# Patient Record
Sex: Female | Born: 1975 | Race: Black or African American | Hispanic: No | Marital: Married | Smoking: Former smoker
Health system: Southern US, Community
[De-identification: ages and names within clinical notes are randomized; demographics above are authoritative.]

## PROBLEM LIST (undated history)

## (undated) DIAGNOSIS — J45909 Unspecified asthma, uncomplicated: Secondary | ICD-10-CM

## (undated) DIAGNOSIS — G8929 Other chronic pain: Secondary | ICD-10-CM

## (undated) DIAGNOSIS — O24419 Gestational diabetes mellitus in pregnancy, unspecified control: Secondary | ICD-10-CM

## (undated) DIAGNOSIS — E282 Polycystic ovarian syndrome: Secondary | ICD-10-CM

## (undated) DIAGNOSIS — I10 Essential (primary) hypertension: Secondary | ICD-10-CM

## (undated) DIAGNOSIS — M5432 Sciatica, left side: Secondary | ICD-10-CM

## (undated) HISTORY — PX: CHOLECYSTECTOMY: SHX55

---

## 1997-07-18 DIAGNOSIS — A749 Chlamydial infection, unspecified: Secondary | ICD-10-CM

## 1997-07-18 HISTORY — DX: Chlamydial infection, unspecified: A74.9

## 2017-03-15 ENCOUNTER — Emergency Department: Payer: Self-pay

## 2017-03-15 ENCOUNTER — Emergency Department
Admission: EM | Admit: 2017-03-15 | Discharge: 2017-03-15 | Disposition: A | Discharge status: Home / Self Care | Payer: Self-pay | Attending: Emergency Medicine | Admitting: Emergency Medicine

## 2017-03-15 DIAGNOSIS — F17200 Nicotine dependence, unspecified, uncomplicated: Secondary | ICD-10-CM | POA: Insufficient documentation

## 2017-03-15 DIAGNOSIS — R1031 Right lower quadrant pain: Secondary | ICD-10-CM | POA: Insufficient documentation

## 2017-03-15 DIAGNOSIS — J45909 Unspecified asthma, uncomplicated: Secondary | ICD-10-CM | POA: Insufficient documentation

## 2017-03-15 HISTORY — DX: Unspecified asthma, uncomplicated: J45.909

## 2017-03-15 LAB — URINALYSIS, COMPLETE (UACMP) WITH MICROSCOPIC
BACTERIA UA: NONE SEEN
Bilirubin Urine: NEGATIVE
GLUCOSE, UA: NEGATIVE mg/dL
KETONES UR: NEGATIVE mg/dL
Leukocytes, UA: NEGATIVE
Nitrite: NEGATIVE
PROTEIN: NEGATIVE mg/dL
Specific Gravity, Urine: 1.019 (ref 1.005–1.030)
pH: 6 (ref 5.0–8.0)

## 2017-03-15 LAB — CBC WITH DIFFERENTIAL/PLATELET
Basophils Absolute: 0.1 10*3/uL (ref 0–0.1)
Basophils Relative: 1 %
Eosinophils Absolute: 0.1 10*3/uL (ref 0–0.7)
Eosinophils Relative: 2 %
HEMATOCRIT: 33.2 % — AB (ref 35.0–47.0)
HEMOGLOBIN: 10.4 g/dL — AB (ref 12.0–16.0)
LYMPHS PCT: 44 %
Lymphs Abs: 2.9 10*3/uL (ref 1.0–3.6)
MCH: 19.4 pg — AB (ref 26.0–34.0)
MCHC: 31.2 g/dL — AB (ref 32.0–36.0)
MCV: 62.2 fL — AB (ref 80.0–100.0)
MONOS PCT: 6 %
Monocytes Absolute: 0.4 10*3/uL (ref 0.2–0.9)
NEUTROS ABS: 3.1 10*3/uL (ref 1.4–6.5)
NEUTROS PCT: 47 %
Platelets: 359 10*3/uL (ref 150–440)
RBC: 5.33 MIL/uL — AB (ref 3.80–5.20)
RDW: 17.7 % — ABNORMAL HIGH (ref 11.5–14.5)
WBC: 6.6 10*3/uL (ref 3.6–11.0)

## 2017-03-15 LAB — BASIC METABOLIC PANEL
ANION GAP: 8 (ref 5–15)
BUN: 8 mg/dL (ref 6–20)
CALCIUM: 8.8 mg/dL — AB (ref 8.9–10.3)
CO2: 22 mmol/L (ref 22–32)
Chloride: 107 mmol/L (ref 101–111)
Creatinine, Ser: 0.79 mg/dL (ref 0.44–1.00)
GLUCOSE: 144 mg/dL — AB (ref 65–99)
POTASSIUM: 3.3 mmol/L — AB (ref 3.5–5.1)
Sodium: 137 mmol/L (ref 135–145)

## 2017-03-15 LAB — HEPATIC FUNCTION PANEL
ALBUMIN: 3.3 g/dL — AB (ref 3.5–5.0)
ALT: 13 U/L — AB (ref 14–54)
AST: 24 U/L (ref 15–41)
Alkaline Phosphatase: 43 U/L (ref 38–126)
Total Bilirubin: 0.3 mg/dL (ref 0.3–1.2)
Total Protein: 6.6 g/dL (ref 6.5–8.1)

## 2017-03-15 LAB — HCG, QUANTITATIVE, PREGNANCY

## 2017-03-15 LAB — LIPASE, BLOOD: Lipase: 27 U/L (ref 11–51)

## 2017-03-15 MED ORDER — ONDANSETRON HCL 4 MG/2ML IJ SOLN
4.0000 mg | Freq: Once | INTRAMUSCULAR | Status: AC
  Administered 2017-03-15: 4 mg via INTRAVENOUS
  Filled 2017-03-15: qty 2

## 2017-03-15 MED ORDER — IOPAMIDOL (ISOVUE-300) INJECTION 61%
100.0000 mL | Freq: Once | INTRAVENOUS | Status: AC | PRN
  Administered 2017-03-15: 100 mL via INTRAVENOUS

## 2017-03-15 MED ORDER — MORPHINE SULFATE (PF) 4 MG/ML IV SOLN
4.0000 mg | Freq: Once | INTRAVENOUS | Status: AC
  Administered 2017-03-15: 4 mg via INTRAVENOUS
  Filled 2017-03-15: qty 1

## 2017-03-15 NOTE — ED Triage Notes (Signed)
Abdominal pain X 2 weeks. Dx with UTI Friday and started on Bactrim. Pt c/o lower back pain as well. Pt alert and oriented X4, active, cooperative, pt in NAD. RR even and unlabored, color WNL.

## 2017-03-15 NOTE — ED Provider Notes (Signed)
[facility] Emergency Department Provider Note  ____________________________________________   First MD Initiated Contact with Patient 03/15/17 1555     (approximate)  I have reviewed the triage vital signs and the nursing notes.   HISTORY  Chief Complaint Abdominal Pain    HPI Erica Pierce is a 41 y.o. female who comes to the emergency department via EMS with 2 weeks of intermittent moderate severity right sided abdominal pain. 6  She has a remote abdominal surgical historydays ago she was seen in her primary care physician's office and diagnosed with a urinary tract infection for which she completed a course of nitrofurantoin. She says this has not helped. She has no back pain. No fevers or chills. No nausea or vomiting.she has no abdominal surgical history. Her pain is currently moderate to severe. She is currently menstruating.   Past Medical History:  Diagnosis Date  . Asthma     There are no active problems to display for this patient.   History reviewed. No pertinent surgical history.  Prior to Admission medications   Not on File    Allergies Sulfa antibiotics and Sulfur  No family history on file.  Social History Social History  Substance Use Topics  . Smoking status: Current Every Day Smoker  . Smokeless tobacco: Never Used  . Alcohol use No    Review of Systems Constitutional: No fever/chills Eyes: No visual changes. ENT: No sore throat. Cardiovascular: Denies chest pain. Respiratory: Denies shortness of breath. Gastrointestinal: positive abdominal pain.  No nausea, no vomiting.  No diarrhea.  No constipation. Genitourinary: Negative for dysuria. Musculoskeletal: Negative for back pain. Skin: Negative for rash. Neurological: Negative for headaches, focal weakness or numbness.   ____________________________________________   PHYSICAL EXAM:  VITAL SIGNS: ED Triage Vitals  Enc Vitals Group     BP 03/15/17 1552  (!) 167/87     Pulse Rate 03/15/17 1552 98     Resp 03/15/17 1552 18     Temp 03/15/17 1552 99.8 F (37.7 C)     Temp Source 03/15/17 1552 Oral     SpO2 03/15/17 1552 100 %     Weight 03/15/17 1553 263 lb (119.3 kg)     Height 03/15/17 1553 5\' 6"  (1.676 m)     Head Circumference --      Peak Flow --      Pain Score 03/15/17 1552 9     Pain Loc --      Pain Edu? --      Excl. in GC? --     Constitutional: alert and oriented 4 pleasant cooperative speaks in full clear sentences no diaphoresis Eyes: PERRL EOMI. Head: Atraumatic. Nose: No congestion/rhinnorhea. Mouth/Throat: No trismus Neck: No stridor.   Cardiovascular: Normal rate, regular rhythm. Grossly normal heart sounds.  Good peripheral circulation. Respiratory: Normal respiratory effort.  No retractions. Lungs CTAB and moving good air Gastrointestinal: obese soft mild diffuse tenderness worse left lower quadrant and right lower quadrant although negative Rovsing's no rebound or guarding Musculoskeletal: No lower extremity edema   Neurologic:  Normal speech and language. No gross focal neurologic deficits are appreciated. Skin:  Skin is warm, dry and intact. No rash noted. Psychiatric: Mood and affect are normal. Speech and behavior are normal.    ____________________________________________   DIFFERENTIAL includes but not limited to  Appendicitis, diverticulitis, small bowel obstruction, urinary tract infection, renal colic, pyelonephritis, ovarian cyst ____________________________________________   LABS (all labs ordered are listed, but only abnormal results are displayed)  Labs Reviewed  BASIC METABOLIC PANEL - Abnormal; Notable for the following:       Result Value   Potassium 3.3 (*)    Glucose, Bld 144 (*)    Calcium 8.8 (*)    All other components within normal limits  HEPATIC FUNCTION PANEL - Abnormal; Notable for the following:    Albumin 3.3 (*)    ALT 13 (*)    Bilirubin, Direct <0.1 (*)    All  other components within normal limits  CBC WITH DIFFERENTIAL/PLATELET - Abnormal; Notable for the following:    RBC 5.33 (*)    Hemoglobin 10.4 (*)    HCT 33.2 (*)    MCV 62.2 (*)    MCH 19.4 (*)    MCHC 31.2 (*)    RDW 17.7 (*)    All other components within normal limits  URINALYSIS, COMPLETE (UACMP) WITH MICROSCOPIC - Abnormal; Notable for the following:    Color, Urine YELLOW (*)    APPearance CLEAR (*)    Hgb urine dipstick LARGE (*)    Squamous Epithelial / LPF 0-5 (*)    All other components within normal limits  URINE CULTURE  CHLAMYDIA/NGC RT PCR ([facility] ONLY)  LIPASE, BLOOD  HCG, QUANTITATIVE, PREGNANCY    Hematuria consistent with menses __________________________________________  EKG   ____________________________________________  RADIOLOGY  CT scan shows resolving cystitis ____________________________________________   PROCEDURES  Procedure(s) performed: no  Procedures  Critical Care performed: no  Observation: no ____________________________________________   INITIAL IMPRESSION / ASSESSMENT AND PLAN / ED COURSE  Pertinent labs & imaging results that were available during my care of the patient were reviewed by me and considered in my medical decision making (see chart for details).  The patient arrives very well-appearing with roughly 1 week of symptoms. She is slightly tender in her left greater than right lower quadrants. No frank peritonitis. She has no costovertebral tenderness negative do not suspect pyelonephritis. At this point given the diagnostic uncertainty however I do believe she warrants labs including a lipase as well as a CT scan abdomen and pelvis to evaluate for appendicitis etc.    ----------------------------------------- 4:29 PM on 03/15/2017 -----------------------------------------  Nursing was able to obtain collateral history. The patient came to the emergency department today because her husband was pulled over  by police and arrested today for possession of narcotics. When the police pulled the car over she reported the abdominal pain and requested transportation to the hospital. ____________________________________________  ----------------------------------------- 5:57 PM on 03/15/2017 -----------------------------------------  The patient's pain is improved. Her abdomen is benign. She is able to eat and drink. Her CT scan is reassuring. I have recommended a 2 day abdominal recheck and strict return precautions. FINAL CLINICAL IMPRESSION(S) / ED DIAGNOSES  Final diagnoses:  Right lower quadrant abdominal pain      NEW MEDICATIONS STARTED DURING THIS VISIT:  New Prescriptions   No medications on file     Note:  This document was prepared using Dragon voice recognition software and may include unintentional dictation errors.     Merrily Brittle, MD 03/15/17 1757

## 2017-03-15 NOTE — Discharge Instructions (Signed)
Fortunately today your CT scan, blood work, and urinalysis were reassuring.  I don't have a clear explanation for your pain however he do not require further antibiotics and do not require surgery. Please make an appointment to follow-up with your primary care physician in 2 days for reexamination and return to the emergency department sooner for any concerns.  It was a pleasure to take care of you today, and thank you for coming to our emergency department.  If you have any questions or concerns before leaving please ask the nurse to grab me and I'm more than happy to go through your aftercare instructions again.  If you were prescribed any opioid pain medication today such as Norco, Vicodin, Percocet, morphine, hydrocodone, or oxycodone please make sure you do not drive when you are taking this medication as it can alter your ability to drive safely.  If you have any concerns once you are home that you are not improving or are in fact getting worse before you can make it to your follow-up appointment, please do not hesitate to call 911 and come back for further evaluation.  Merrily BrittleNeil Yaritzi Craun, MD  Results for orders placed or performed during the hospital encounter of 03/15/17  Basic metabolic panel  Result Value Ref Range   Sodium 137 135 - 145 mmol/L   Potassium 3.3 (L) 3.5 - 5.1 mmol/L   Chloride 107 101 - 111 mmol/L   CO2 22 22 - 32 mmol/L   Glucose, Bld 144 (H) 65 - 99 mg/dL   BUN 8 6 - 20 mg/dL   Creatinine, Ser 1.610.79 0.44 - 1.00 mg/dL   Calcium 8.8 (L) 8.9 - 10.3 mg/dL   GFR calc non Af Amer >60 >60 mL/min   GFR calc Af Amer >60 >60 mL/min   Anion gap 8 5 - 15  Hepatic function panel  Result Value Ref Range   Total Protein 6.6 6.5 - 8.1 g/dL   Albumin 3.3 (L) 3.5 - 5.0 g/dL   AST 24 15 - 41 U/L   ALT 13 (L) 14 - 54 U/L   Alkaline Phosphatase 43 38 - 126 U/L   Total Bilirubin 0.3 0.3 - 1.2 mg/dL   Bilirubin, Direct <0.9<0.1 (L) 0.1 - 0.5 mg/dL   Indirect Bilirubin NOT CALCULATED 0.3 -  0.9 mg/dL  Lipase, blood  Result Value Ref Range   Lipase 27 11 - 51 U/L  CBC with Differential  Result Value Ref Range   WBC 6.6 3.6 - 11.0 K/uL   RBC 5.33 (H) 3.80 - 5.20 MIL/uL   Hemoglobin 10.4 (L) 12.0 - 16.0 g/dL   HCT 60.433.2 (L) 54.035.0 - 98.147.0 %   MCV 62.2 (L) 80.0 - 100.0 fL   MCH 19.4 (L) 26.0 - 34.0 pg   MCHC 31.2 (L) 32.0 - 36.0 g/dL   RDW 19.117.7 (H) 47.811.5 - 29.514.5 %   Platelets 359 150 - 440 K/uL   Neutrophils Relative % 47 %   Neutro Abs 3.1 1.4 - 6.5 K/uL   Lymphocytes Relative 44 %   Lymphs Abs 2.9 1.0 - 3.6 K/uL   Monocytes Relative 6 %   Monocytes Absolute 0.4 0.2 - 0.9 K/uL   Eosinophils Relative 2 %   Eosinophils Absolute 0.1 0 - 0.7 K/uL   Basophils Relative 1 %   Basophils Absolute 0.1 0 - 0.1 K/uL  Urinalysis, Complete w Microscopic  Result Value Ref Range   Color, Urine YELLOW (A) YELLOW   APPearance CLEAR (A) CLEAR  Specific Gravity, Urine 1.019 1.005 - 1.030   pH 6.0 5.0 - 8.0   Glucose, UA NEGATIVE NEGATIVE mg/dL   Hgb urine dipstick LARGE (A) NEGATIVE   Bilirubin Urine NEGATIVE NEGATIVE   Ketones, ur NEGATIVE NEGATIVE mg/dL   Protein, ur NEGATIVE NEGATIVE mg/dL   Nitrite NEGATIVE NEGATIVE   Leukocytes, UA NEGATIVE NEGATIVE   RBC / HPF TOO NUMEROUS TO COUNT 0 - 5 RBC/hpf   WBC, UA 6-30 0 - 5 WBC/hpf   Bacteria, UA NONE SEEN NONE SEEN   Squamous Epithelial / LPF 0-5 (A) NONE SEEN   Mucus PRESENT   hCG, quantitative, pregnancy  Result Value Ref Range   hCG, Beta Chain, Quant, S <1 <5 mIU/mL   Ct Abdomen Pelvis W Contrast  Result Date: 03/15/2017 CLINICAL DATA:  Initial evaluation for acute abdominal pain for 2 weeks, recently diagnosed with UTI. EXAM: CT ABDOMEN AND PELVIS WITH CONTRAST TECHNIQUE: Multidetector CT imaging of the abdomen and pelvis was performed using the standard protocol following bolus administration of intravenous contrast. CONTRAST:  ISOVUE-300 IOPAMIDOL (ISOVUE-300) INJECTION 61% COMPARISON:  None available. FINDINGS: Lower  chest: Mild scattered subsegmental atelectatic changes present within the visualized lung bases. Visualized lungs are otherwise clear. Hepatobiliary: Subcentimeter hypodensity within the right hepatic lobe noted, too small the characterize, but of doubtful clinical significance. Liver otherwise demonstrates a normal contrast enhanced appearance. Gallbladder surgically absent. Mild intra and extrahepatic biliary dilatation likely related post cholecystectomy changes. Pancreas: Pancreas within normal limits. Spleen: Spleen within normal limits. Adrenals/Urinary Tract: Adrenal glands are normal. Kidneys equal in size with symmetric enhancement. No nephrolithiasis, hydronephrosis, or focal enhancing renal mass. No findings to suggest acute pyelonephritis. No hydroureter. Bladder partially distended. Circumferential bladder wall thickening may be related incomplete distension. Superimposed acute cystitis not entirely excluded. Stomach/Bowel: Small amount of layering secretions/ debris noted within the distal esophageal lumen. Stomach within normal limits. No evidence for bowel obstruction. Appendix is normal. No abnormal wall thickening, mucosal enhancement, or inflammatory fat stranding seen about the bowels. Prominent cecal diverticulum noted. Vascular/Lymphatic: Normal intravascular enhancement seen throughout the intra-abdominal aorta and its branch vessels. No pathologically enlarged intra-abdominal or pelvic lymph nodes identified. Reproductive: Uterus and ovaries within normal limits for age. Other: Trace free fluid within the pelvis, likely physiologic. No free intraperitoneal air. Musculoskeletal: No acute osseus abnormality. No worrisome lytic or blastic osseous lesions. Bilateral facet arthropathy noted within the lower lumbar spine. IMPRESSION: 1. Mild circumferential bladder wall thickening. While this finding may be related incomplete distension, possible acute cystitis could also have this appearance.  Correlation with urinalysis recommended. 2. No other acute intra-abdominal or pelvic process. No CT evidence for upper urinary tract infection. 3. Bilateral facet arthropathy within the lower lumbar spine. Electronically Signed   By: Rise Mu M.D.   On: 03/15/2017 17:48

## 2017-03-15 NOTE — ED Notes (Signed)
Pt alert and oriented X4, active, cooperative, pt in NAD. RR even and unlabored, color WNL.  Pt informed to return if any life threatening symptoms occur.   

## 2017-03-15 NOTE — ED Notes (Signed)
Patient transported to CT 

## 2017-03-15 NOTE — ED Notes (Signed)
Denies burning with urination, states color is lighter than it was before taking antibiotics. Denies any abnormal discharge.

## 2017-03-17 LAB — URINE CULTURE

## 2017-05-29 ENCOUNTER — Emergency Department
Admission: EM | Admit: 2017-05-29 | Discharge: 2017-05-29 | Disposition: A | Discharge status: Home / Self Care | Payer: Worker's Compensation | Attending: Emergency Medicine | Admitting: Emergency Medicine

## 2017-05-29 ENCOUNTER — Other Ambulatory Visit: Payer: Self-pay

## 2017-05-29 ENCOUNTER — Emergency Department: Payer: Worker's Compensation

## 2017-05-29 DIAGNOSIS — Y9289 Other specified places as the place of occurrence of the external cause: Secondary | ICD-10-CM | POA: Diagnosis not present

## 2017-05-29 DIAGNOSIS — Y990 Civilian activity done for income or pay: Secondary | ICD-10-CM | POA: Insufficient documentation

## 2017-05-29 DIAGNOSIS — J45909 Unspecified asthma, uncomplicated: Secondary | ICD-10-CM | POA: Diagnosis not present

## 2017-05-29 DIAGNOSIS — F17210 Nicotine dependence, cigarettes, uncomplicated: Secondary | ICD-10-CM | POA: Diagnosis not present

## 2017-05-29 DIAGNOSIS — M791 Myalgia, unspecified site: Secondary | ICD-10-CM | POA: Diagnosis not present

## 2017-05-29 DIAGNOSIS — S39012A Strain of muscle, fascia and tendon of lower back, initial encounter: Secondary | ICD-10-CM

## 2017-05-29 DIAGNOSIS — G8911 Acute pain due to trauma: Secondary | ICD-10-CM | POA: Diagnosis not present

## 2017-05-29 DIAGNOSIS — S3992XA Unspecified injury of lower back, initial encounter: Secondary | ICD-10-CM | POA: Diagnosis present

## 2017-05-29 DIAGNOSIS — X500XXA Overexertion from strenuous movement or load, initial encounter: Secondary | ICD-10-CM | POA: Insufficient documentation

## 2017-05-29 DIAGNOSIS — Y9389 Activity, other specified: Secondary | ICD-10-CM | POA: Insufficient documentation

## 2017-05-29 MED ORDER — NAPROXEN 500 MG PO TABS: 500.0000 mg | ORAL_TABLET | Freq: Two times a day (BID) | ORAL | 0 refills | Status: DC

## 2017-05-29 MED ORDER — KETOROLAC TROMETHAMINE 30 MG/ML IJ SOLN
30.0000 mg | Freq: Once | INTRAMUSCULAR | Status: AC
  Administered 2017-05-29: 30 mg via INTRAMUSCULAR
  Filled 2017-05-29: qty 1

## 2017-05-29 MED ORDER — CYCLOBENZAPRINE HCL 10 MG PO TABS
10.0000 mg | ORAL_TABLET | Freq: Once | ORAL | Status: AC
  Administered 2017-05-29: 10 mg via ORAL
  Filled 2017-05-29: qty 1

## 2017-05-29 MED ORDER — CYCLOBENZAPRINE HCL 10 MG PO TABS: 10.0000 mg | ORAL_TABLET | Freq: Three times a day (TID) | ORAL | 0 refills | Status: DC | PRN

## 2017-05-29 NOTE — Discharge Instructions (Signed)
Follow up with primary care provider for symptoms that are not improving over the week. Return to the emergency department for symptoms that change or worsen if you are unable to schedule an appointment.

## 2017-05-29 NOTE — ED Notes (Signed)
Family at bedside. 

## 2017-05-29 NOTE — ED Triage Notes (Signed)
Pt reports back pain after bending over at work. Pt able to ambulate without difficulty. Reports this happened this AM. Pt works at [employer] and wants to file workers comp.

## 2017-05-29 NOTE — ED Provider Notes (Signed)
[facility] Emergency Department Provider Note ____________________________________________  Time seen: Approximately 9:07 AM  I have reviewed the triage vital signs and the nursing notes.   HISTORY  Chief Complaint Back Pain    HPI Erica Pierce is a 41 y.o. 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 Incident occurred this morning at about 2:00 AM. She states she felt pain immediately afterward. Pain radiates down both legs. She now also has pain in the left lateral neck and shoulder. Ibuprofen at 4:00 AM has not helped with her pain.  Past Medical History:  Diagnosis Date  . Asthma     There are no active problems to display for this patient.   History reviewed. No pertinent surgical history.  Prior to Admission medications   Not on File    Allergies Sulfa antibiotics and Sulfur  No family history on file.  Social History Social History   Tobacco Use  . Smoking status: Current Every Day Smoker  . Smokeless tobacco: Never Used  Substance Use Topics  . Alcohol use: No  . Drug use: Not on file    Review of Systems Constitutional: Well appearing. Cardiovascular: Negative for change in skin temperature or color. Respiratory: Negative for dyspnea. Musculoskeletal:   Negative for fecal incontinence,  Saddle anesthesia, or urinary retention  Negative for immunosuppression, IV drug use, or fever  Negative for chronic steroid use   Negative for trauma in the presence of osteoporosis  Negative for age over 6650 and trauma.  Negative for constitutional symptoms, or history of cancer  Negative for pain worse at night.  Negative for focal neurologic deficit, progressive, or disabling symptoms Skin: Negative for rash, lesion, or wound.  Neurological: Negative for burning, tingling, numb, electric pain,  Positive for radiating pain in the bilateral lower extremities.  ____________________________________________   PHYSICAL EXAM:  VITAL SIGNS: ED Triage Vitals [05/29/17 0834]  Enc Vitals Group     BP 139/87     Pulse Rate 86     Resp 18     Temp 97.7 F (36.5 C)     Temp Source Oral     SpO2 99 %     Weight 240 lb (108.9 kg)     Height 5\' 6"  (1.676 m)     Head Circumference      Peak Flow      Pain Score 10     Pain Loc      Pain Edu?      Excl. in GC?     Constitutional: Alert and oriented. Well appearing and in no acute distress. Eyes: Conjunctivae are clear without discharge or drainage.  Head: Atraumatic. Neck: Full, active range of motion. Respiratory: Respirations even and unlabored. Musculoskeletal: Decreased flexion at the lumbar due to pain, Strength 5/5 of the lower extremities as tested. Full, active range of motion of the left shoulder without step-off or deformity. Full, active range of motion of the cervical spine is noted with no focal midline tenderness. Neurologic: Reflexes of the lower extremities are 2+. Negative straight leg raise on the right and left side. Skin: Atraumatic.  Psychiatric: Behavior and affect are normal.  ____________________________________________   LABS (all labs ordered are listed, but only abnormal results are displayed)  Labs Reviewed - No data to display ____________________________________________  RADIOLOGY  Lumbar Spine:  IMPRESSION:  Normal alignment without  significant disc space narrowing.   ____________________________________________   PROCEDURES  Procedure(s) performed: None  ____________________________________________   INITIAL IMPRESSION / ASSESSMENT AND PLAN / ED COURSE  Erica Pierce is a 41 y.o. female who presents to the emergency department for evaluation after sustaining an injury to her lower back while at work. X-ray of the lumbar spine to be performed. Toradol and Flexeril have been  ordered.  ----------------------------------------- 10:12 AM on 05/29/2017 -----------------------------------------  X-ray results are negative and were discussed with the patient. She will be given prescriptions for Flexeril and Naprosyn and advised to rest and apply ice to sore areas today. She was encouraged to follow up with her primary care provider for symptoms that are not improving over the next week or so. She was instructed to return to the emergency department for symptoms that change or worsen if she is unable schedule an appointment.  Pertinent labs & imaging results that were available during my care of the patient were reviewed by me and considered in my medical decision making (see chart for details).  _________________________________________   FINAL CLINICAL IMPRESSION(S) / ED DIAGNOSES  Final diagnoses:  None    This SmartLink is deprecated. Use AVSMEDLIST instead to display the medication list for a patient.  If controlled substance prescribed during this visit, 12 month history viewed on the [facility] prior to issuing an initial prescription for Schedule II or III opiod.    Chinita Pesterriplett, Momen Ham B, FNP 05/29/17 1013    Jeanmarie PlantMcShane, James A, MD 05/29/17 1537

## 2017-06-12 ENCOUNTER — Other Ambulatory Visit: Payer: Self-pay | Admitting: Physician Assistant

## 2017-06-12 ENCOUNTER — Ambulatory Visit
Admission: RE | Admit: 2017-06-12 | Discharge: 2017-06-12 | Disposition: A | Discharge status: Home / Self Care | Payer: Self-pay | Source: Ambulatory Visit | Attending: Physician Assistant | Admitting: Physician Assistant

## 2017-06-12 ENCOUNTER — Ambulatory Visit
Admission: RE | Admit: 2017-06-12 | Discharge: 2017-06-12 | Disposition: A | Discharge status: Home / Self Care | Payer: Worker's Compensation | Source: Ambulatory Visit | Attending: Physician Assistant | Admitting: Physician Assistant

## 2017-06-12 DIAGNOSIS — M25512 Pain in left shoulder: Secondary | ICD-10-CM

## 2017-08-15 ENCOUNTER — Other Ambulatory Visit: Payer: Self-pay

## 2017-08-15 ENCOUNTER — Encounter: Payer: Self-pay | Admitting: Emergency Medicine

## 2017-08-15 ENCOUNTER — Emergency Department: Payer: Self-pay

## 2017-08-15 ENCOUNTER — Emergency Department
Admission: EM | Admit: 2017-08-15 | Discharge: 2017-08-15 | Disposition: A | Discharge status: Home / Self Care | Payer: Self-pay | Attending: Emergency Medicine | Admitting: Emergency Medicine

## 2017-08-15 DIAGNOSIS — R002 Palpitations: Secondary | ICD-10-CM | POA: Insufficient documentation

## 2017-08-15 DIAGNOSIS — F17200 Nicotine dependence, unspecified, uncomplicated: Secondary | ICD-10-CM | POA: Insufficient documentation

## 2017-08-15 DIAGNOSIS — K047 Periapical abscess without sinus: Secondary | ICD-10-CM | POA: Insufficient documentation

## 2017-08-15 DIAGNOSIS — Z79899 Other long term (current) drug therapy: Secondary | ICD-10-CM | POA: Insufficient documentation

## 2017-08-15 DIAGNOSIS — J45909 Unspecified asthma, uncomplicated: Secondary | ICD-10-CM | POA: Insufficient documentation

## 2017-08-15 LAB — BASIC METABOLIC PANEL
ANION GAP: 6 (ref 5–15)
BUN: 5 mg/dL — ABNORMAL LOW (ref 6–20)
CHLORIDE: 103 mmol/L (ref 101–111)
CO2: 24 mmol/L (ref 22–32)
Calcium: 8.6 mg/dL — ABNORMAL LOW (ref 8.9–10.3)
Creatinine, Ser: 0.87 mg/dL (ref 0.44–1.00)
GFR calc Af Amer: >60 mL/min (ref >60)
GLUCOSE: 108 mg/dL — AB (ref 65–99)
POTASSIUM: 3.8 mmol/L (ref 3.5–5.1)
Sodium: 133 mmol/L — ABNORMAL LOW (ref 135–145)

## 2017-08-15 LAB — CBC
HCT: 34.6 % — ABNORMAL LOW (ref 35.0–47.0)
Hemoglobin: 10.6 g/dL — ABNORMAL LOW (ref 12.0–16.0)
MCH: 19.2 pg — AB (ref 26.0–34.0)
MCHC: 30.6 g/dL — AB (ref 32.0–36.0)
MCV: 62.9 fL — ABNORMAL LOW (ref 80.0–100.0)
PLATELETS: 376 10*3/uL (ref 150–440)
RBC: 5.5 MIL/uL — ABNORMAL HIGH (ref 3.80–5.20)
RDW: 17.5 % — ABNORMAL HIGH (ref 11.5–14.5)
WBC: 6.1 10*3/uL (ref 3.6–11.0)

## 2017-08-15 LAB — TROPONIN I

## 2017-08-15 MED ORDER — PENICILLIN V POTASSIUM 250 MG PO TABS: 250.0000 mg | ORAL_TABLET | Freq: Four times a day (QID) | ORAL | 0 refills | Status: DC

## 2017-08-15 MED ORDER — TRAMADOL HCL 50 MG PO TABS: 50.0000 mg | ORAL_TABLET | Freq: Four times a day (QID) | ORAL | 0 refills | Status: AC | PRN

## 2017-08-15 NOTE — ED Provider Notes (Signed)
[facility] Emergency Department Provider Note   ____________________________________________    I have reviewed the triage vital signs and the nursing notes.   HISTORY  Chief Complaint Palpitations    HPI Erica Pierce is a 42 y.o. female who presents with complaints of palpitations over the last several days.  Patient reports that she does not have a racing heart rather she feels that her heart skips a beat every once in a while.  She has not felt it in several hours.  Currently she feels well and has no complaints.  Despite nursing notes she denies chest pain to me.  She does report she had a tightness in her chest 3 days ago which resolved and has not recurred.  Additionally she is complaining of a painful tooth on the right lower jaw which she has had for several days   Past Medical History:  Diagnosis Date  . Asthma     There are no active problems to display for this patient.   Past Surgical History:  Procedure Laterality Date  . CHOLECYSTECTOMY      Prior to Admission medications   Medication Sig Start Date End Date Taking? Authorizing Provider  cyclobenzaprine (FLEXERIL) 10 MG tablet Take 1 tablet (10 mg total) 3 (three) times daily as needed by mouth for muscle spasms. 05/29/17   Triplett, Rulon Eisenmengerari B, FNP  naproxen (NAPROSYN) 500 MG tablet Take 1 tablet (500 mg total) 2 (two) times daily with a meal by mouth. 05/29/17   Triplett, Cari B, FNP  penicillin v potassium (VEETID) 250 MG tablet Take 1 tablet (250 mg total) by mouth 4 (four) times daily. 08/15/17   Jene EveryKinner, Ryleigh Buenger, MD  traMADol (ULTRAM) 50 MG tablet Take 1 tablet (50 mg total) by mouth every 6 (six) hours as needed. 08/15/17 08/15/18  Jene EveryKinner, Stefany Starace, MD     Allergies Sulfa antibiotics and Sulfur  No family history on file.  Social History Social History   Tobacco Use  . Smoking status: Current Every Day Smoker  . Smokeless tobacco: Never Used  Substance Use Topics  .  Alcohol use: No  . Drug use: Not on file    Review of Systems  Constitutional: No fever/chills Eyes: No visual changes.  ENT: Dental pain as above Cardiovascular: Denies chest pain. Respiratory: Denies shortness of breath. Gastrointestinal: No abdominal pain.  No nausea, no vomiting.   Genitourinary: Negative for dysuria. Musculoskeletal: Negative for back pain. Skin: Negative for rash. Neurological: Negative for headaches   ____________________________________________   PHYSICAL EXAM:  VITAL SIGNS: ED Triage Vitals  Enc Vitals Group     BP 08/15/17 1219 124/83     Pulse Rate 08/15/17 1219 73     Resp 08/15/17 1219 16     Temp 08/15/17 1219 98.8 F (37.1 C)     Temp Source 08/15/17 1219 Oral     SpO2 08/15/17 1219 96 %     Weight 08/15/17 1217 117.5 kg (259 lb)     Height 08/15/17 1217 1.676 m (5\' 6" )     Head Circumference --      Peak Flow --      Pain Score 08/15/17 1217 7     Pain Loc --      Pain Edu? --      Excl. in GC? --     Constitutional: Alert and oriented. No acute distress. Pleasant and interactive Eyes: Conjunctivae are normal.  Head: Atraumatic. Nose: No congestion/rhinnorhea. Mouth/Throat: Mucous membranes are moist.  No evidence of dental abscess Neck:  Painless ROM Cardiovascular: Normal rate, regular rhythm.  2 out of 6 systolic ejection murmur good peripheral circulation. Respiratory: Normal respiratory effort.  No retractions. Lungs CTAB. Gastrointestinal: Soft and nontender. No distention.  No CVA tenderness. Genitourinary: deferred Musculoskeletal: Warm and well perfused Neurologic:  Normal speech and language. No gross focal neurologic deficits are appreciated.  Skin:  Skin is warm, dry and intact. No rash noted. Psychiatric: Mood and affect are normal. Speech and behavior are normal.  ____________________________________________   LABS (all labs ordered are listed, but only abnormal results are displayed)  Labs Reviewed  BASIC  METABOLIC PANEL - Abnormal; Notable for the following components:      Result Value   Sodium 133 (*)    Glucose, Bld 108 (*)    BUN 5 (*)    Calcium 8.6 (*)    All other components within normal limits  CBC - Abnormal; Notable for the following components:   RBC 5.50 (*)    Hemoglobin 10.6 (*)    HCT 34.6 (*)    MCV 62.9 (*)    MCH 19.2 (*)    MCHC 30.6 (*)    RDW 17.5 (*)    All other components within normal limits  TROPONIN I  POC URINE PREG, ED   ____________________________________________  EKG  ED ECG REPORT I, Jene Every, the attending physician, personally viewed and interpreted this ECG.  Date: 08/15/2017  Rhythm: normal sinus rhythm QRS Axis: normal Intervals: normal ST/T Wave abnormalities: normal Narrative Interpretation: no evidence of acute ischemia  ____________________________________________  RADIOLOGY  X-ray unremarkable ____________________________________________   PROCEDURES  Procedure(s) performed: No  Procedures   Critical Care performed: No ____________________________________________   INITIAL IMPRESSION / ASSESSMENT AND PLAN / ED COURSE  Pertinent labs & imaging results that were available during my care of the patient were reviewed by me and considered in my medical decision making (see chart for details).  Patient well-appearing in no acute distress.  Her exam is overall quite reassuring, mild systolic ejection murmur, she is not sure if she is always had this.  No evidence of dental abscess.  Lab work is reassuring, troponin normal, EKG unremarkable, chest x-ray normal.  We will have her follow-up with cardiology for further evaluation of sensation of a skipped beat and murmur    ____________________________________________   FINAL CLINICAL IMPRESSION(S) / ED DIAGNOSES  Final diagnoses:  Palpitations  Dental infection        Note:  This document was prepared using Dragon voice recognition software and may  include unintentional dictation errors.    Jene Every, MD 08/15/17 [phone]

## 2017-08-15 NOTE — ED Triage Notes (Signed)
C/O chest pain x 3 days.  States over past three days pain has been intermittent.  Since last night pain has been constant.  Patient also c/o heart fluttering.  Patient states she has an abscessed tooth on right lower jaw "for a while".

## 2019-11-09 ENCOUNTER — Encounter: Payer: Self-pay | Admitting: Emergency Medicine

## 2019-11-09 ENCOUNTER — Emergency Department
Admission: EM | Admit: 2019-11-09 | Discharge: 2019-11-09 | Disposition: A | Discharge status: Home / Self Care | Payer: Self-pay | Attending: Student | Admitting: Student

## 2019-11-09 ENCOUNTER — Other Ambulatory Visit: Payer: Self-pay

## 2019-11-09 ENCOUNTER — Emergency Department: Payer: Self-pay

## 2019-11-09 DIAGNOSIS — O23591 Infection of other part of genital tract in pregnancy, first trimester: Secondary | ICD-10-CM | POA: Insufficient documentation

## 2019-11-09 DIAGNOSIS — F17210 Nicotine dependence, cigarettes, uncomplicated: Secondary | ICD-10-CM | POA: Insufficient documentation

## 2019-11-09 DIAGNOSIS — Z3490 Encounter for supervision of normal pregnancy, unspecified, unspecified trimester: Secondary | ICD-10-CM

## 2019-11-09 DIAGNOSIS — B9689 Other specified bacterial agents as the cause of diseases classified elsewhere: Secondary | ICD-10-CM | POA: Insufficient documentation

## 2019-11-09 DIAGNOSIS — R109 Unspecified abdominal pain: Secondary | ICD-10-CM | POA: Insufficient documentation

## 2019-11-09 DIAGNOSIS — R102 Pelvic and perineal pain: Secondary | ICD-10-CM | POA: Insufficient documentation

## 2019-11-09 DIAGNOSIS — Z3A01 Less than 8 weeks gestation of pregnancy: Secondary | ICD-10-CM | POA: Insufficient documentation

## 2019-11-09 LAB — CBC
HCT: 35.3 % — ABNORMAL LOW (ref 36.0–46.0)
Hemoglobin: 10.5 g/dL — ABNORMAL LOW (ref 12.0–15.0)
MCH: 19.2 pg — ABNORMAL LOW (ref 26.0–34.0)
MCHC: 29.7 g/dL — ABNORMAL LOW (ref 30.0–36.0)
MCV: 64.5 fL — ABNORMAL LOW (ref 80.0–100.0)
Platelets: 377 10*3/uL (ref 150–400)
RBC: 5.47 MIL/uL — ABNORMAL HIGH (ref 3.87–5.11)
RDW: 17.9 % — ABNORMAL HIGH (ref 11.5–15.5)
WBC: 9 10*3/uL (ref 4.0–10.5)
nRBC: 0 % (ref 0.0–0.2)

## 2019-11-09 LAB — URINALYSIS, COMPLETE (UACMP) WITH MICROSCOPIC
Bacteria, UA: NONE SEEN
Bilirubin Urine: NEGATIVE
Glucose, UA: NEGATIVE mg/dL
Hgb urine dipstick: NEGATIVE
Ketones, ur: NEGATIVE mg/dL
Nitrite: NEGATIVE
Protein, ur: NEGATIVE mg/dL
Specific Gravity, Urine: >1.03 — ABNORMAL HIGH (ref 1.005–1.030)
pH: 6.5 (ref 5.0–8.0)

## 2019-11-09 LAB — COMPREHENSIVE METABOLIC PANEL
ALT: 14 U/L (ref 0–44)
AST: 16 U/L (ref 15–41)
Albumin: 3.7 g/dL (ref 3.5–5.0)
Alkaline Phosphatase: 44 U/L (ref 38–126)
Anion gap: 9 (ref 5–15)
BUN: 9 mg/dL (ref 6–20)
CO2: 24 mmol/L (ref 22–32)
Calcium: 9.6 mg/dL (ref 8.9–10.3)
Chloride: 102 mmol/L (ref 98–111)
Creatinine, Ser: 0.67 mg/dL (ref 0.44–1.00)
GFR calc Af Amer: >60 mL/min (ref >60)
GFR calc non Af Amer: >60 mL/min (ref >60)
Glucose, Bld: 95 mg/dL (ref 70–99)
Potassium: 4 mmol/L (ref 3.5–5.1)
Sodium: 135 mmol/L (ref 135–145)
Total Bilirubin: 0.3 mg/dL (ref 0.3–1.2)
Total Protein: 7.3 g/dL (ref 6.5–8.1)

## 2019-11-09 LAB — LIPASE, BLOOD: Lipase: 37 U/L (ref 11–51)

## 2019-11-09 LAB — POCT PREGNANCY, URINE: Preg Test, Ur: POSITIVE — AB

## 2019-11-09 MED ORDER — SODIUM CHLORIDE 0.9% FLUSH: 3.0000 mL | Freq: Once | INTRAVENOUS | Status: DC

## 2019-11-09 MED ORDER — METRONIDAZOLE 500 MG PO TABS: 500.0000 mg | ORAL_TABLET | Freq: Two times a day (BID) | ORAL | 0 refills | Status: AC

## 2019-11-09 NOTE — ED Provider Notes (Signed)
[facility] Emergency Department Provider Note  ____________________________________________   First MD Initiated Contact with Patient 11/09/19 1757     (approximate)  I have reviewed the triage vital signs and the nursing notes.  History  Chief Complaint Abdominal Pain    HPI Erica Pierce is a 44 y.o. female who presents to the emergency department for lower abdominal pain in the setting of pregnancy.  LMP sometime in March, which was normal.  Then in early April she had light pink spotting for 1 day.  Patient states she went for a routine yearly annual visit on 4/23 where she was diagnosed with a positive pregnancy test.  She has had some ongoing left lower abdominal discomfort, on and off for some time.  However, in the setting of her pregnancy she was worried and presented for further evaluation.  Pain is currently mild, she describes it as a cramping sensation, located in the left lower abdomen/pelvis area.  No radiation.  No alleviating or aggravating components.   She reports very minimal light brown discharge and odor that she can only see in her underwear. Similar to prior episodes of BV.  She did have a pelvic exam and STD testing done at her clinic visit on 4/23.  On chart review it appears her wet prep was positive for clue cells. States she wasn't told any positive results or started on anything yet.  She denies any vaginal bleeding.  She has an appointment scheduled for next week, to establish with [facility].  Past Medical Hx Past Medical History:  Diagnosis Date  . Asthma     Problem List There are no problems to display for this patient.   Past Surgical Hx Past Surgical History:  Procedure Laterality Date  . CHOLECYSTECTOMY      Medications Prior to Admission medications   Medication Sig Start Date End Date Taking? Authorizing Provider  cyclobenzaprine (FLEXERIL) 10 MG tablet Take 1 tablet (10 mg total) 3 (three) times daily  as needed by mouth for muscle spasms. 05/29/17   Triplett, Johnette Abraham B, FNP  naproxen (NAPROSYN) 500 MG tablet Take 1 tablet (500 mg total) 2 (two) times daily with a meal by mouth. 05/29/17   Triplett, Cari B, FNP  penicillin v potassium (VEETID) 250 MG tablet Take 1 tablet (250 mg total) by mouth 4 (four) times daily. 08/15/17   Lavonia Drafts, MD    Allergies Sulfa antibiotics and Sulfur  Family Hx No family history on file.  Social Hx Social History   Tobacco Use  . Smoking status: Current Every Day Smoker  . Smokeless tobacco: Never Used  Substance Use Topics  . Alcohol use: No  . Drug use: Not on file     Review of Systems  Constitutional: Negative for fever. Negative for chills. Eyes: Negative for visual changes. ENT: Negative for sore throat. Cardiovascular: Negative for chest pain. Respiratory: Negative for shortness of breath. Gastrointestinal: Negative for nausea. Negative for vomiting.  Genitourinary: Positive for vaginal discharge, positive for pregnancy. Musculoskeletal: Negative for leg swelling. Skin: Negative for rash. Neurological: Negative for headaches.   Physical Exam  Vital Signs: ED Triage Vitals  Enc Vitals Group     BP 11/09/19 1644 121/64     Pulse Rate 11/09/19 1644 70     Resp 11/09/19 1644 16     Temp 11/09/19 1644 98.5 F (36.9 C)     Temp Source 11/09/19 1644 Oral     SpO2 11/09/19 1644 100 %  Weight 11/09/19 1649 261 lb (118.4 kg)     Height 11/09/19 1649 5\' 6"  (1.676 m)     Head Circumference --      Peak Flow --      Pain Score 11/09/19 1649 9     Pain Loc --      Pain Edu? --      Excl. in GC? --     Constitutional: Alert and oriented. Well appearing. NAD.  Head: Normocephalic. Atraumatic. Eyes: Conjunctivae clear. Sclera anicteric. Pupils equal and symmetric. Nose: No masses or lesions. No congestion or rhinorrhea. Mouth/Throat: Wearing mask.  Neck: No stridor. Trachea midline.  Cardiovascular: Normal rate, regular  rhythm. Extremities well perfused. Respiratory: Normal respiratory effort.  Lungs CTAB. Gastrointestinal: Soft. Non-distended. Non-tender.  Genitourinary: Deferred in the setting of recent testing/exam done yesterday. Musculoskeletal: No lower extremity edema. No deformities. Neurologic:  Normal speech and language. No gross focal or lateralizing neurologic deficits are appreciated.  Skin: Skin is warm, dry and intact. No rash noted. Psychiatric: Mood and affect are appropriate for situation.   Radiology  Personally reviewed available imaging myself.   Ultrasound - IMPRESSION:  1. Single live IUP.  2. 2 dominant follicles in the right ovary. Recommend attention on  follow-up.    Procedures  Procedure(s) performed (including critical care):  Procedures   Initial Impression / Assessment and Plan / MDM / ED Course  44 y.o. female who presents to the ED for left lower abdominal pain in the setting of pregnancy.  Ddx: early pregnancy, ectopic, threatened abortion, MSK, pelvic infection  Will plan for labs, ultrasound imaging.  On chart review, patient had wet prep performed in clinic yesterday, which was positive for clue cells.  Updated her on these results.  Ultrasound reveals a single live IUP with [facility] June 27, 2020.  As such, given reassuring work-up, will plan for discharge.  Will provide Rx for metronidazole for treatment of BV seen on her swabs done yesterday.  Advise adherence to her follow-up appointment next week.  Given return precautions.  Patient voices understanding and is comfortable with the plan and discharge.   _______________________________   As part of my medical decision making I have reviewed available labs, radiology tests, reviewed old records/performed chart review.   Final Clinical Impression(s) / ED Diagnosis  Final diagnoses:  Left sided abdominal pain  Pregnancy, unspecified gestational age  Bacterial vaginosis in pregnancy        Note:  This document was prepared using Dragon voice recognition software and may include unintentional dictation errors.   June 29, 2020., MD 11/09/19 [phone]

## 2019-11-09 NOTE — ED Triage Notes (Signed)
Pt to ED via POV c/o LLQ abd pain. Pt states that she just found out on Friday that she is pregnant. Pt states that she believes that she about [redacted] weeks pregnant. Pt states that she has had pain for a while pain has been worse since Friday. Pt states she has had "flutters" in her abdomen. Pt states that she is having nausea but no vomiting at this time.

## 2019-11-09 NOTE — ED Notes (Signed)
Pt reports that she is also having white vaginal discharge and odor.

## 2019-11-09 NOTE — Discharge Instructions (Addendum)
Thank you for letting us take care of you in the emergency department today.   Please continue to take any regular, prescribed medications.   New medications we have prescribed:  Metronidazole  Please follow up with: Your OBGYN to review your ER visit and follow up on your symptoms.    Please return to the ER for any new or worsening symptoms.

## 2019-11-09 NOTE — ED Triage Notes (Signed)
FIRST NURSE NOTE: Pt c/o abdominal pain, pregnant unknown how far along at this time. Pt has not had [place] yet. Pt placed in wheelchair on arrival.

## 2019-12-13 ENCOUNTER — Encounter: Payer: Self-pay | Admitting: Emergency Medicine

## 2019-12-13 ENCOUNTER — Emergency Department
Admission: EM | Admit: 2019-12-13 | Discharge: 2019-12-13 | Disposition: A | Discharge status: Home / Self Care | Payer: Medicaid Other | Attending: Emergency Medicine | Admitting: Emergency Medicine

## 2019-12-13 ENCOUNTER — Other Ambulatory Visit: Payer: Self-pay

## 2019-12-13 DIAGNOSIS — R42 Dizziness and giddiness: Secondary | ICD-10-CM | POA: Insufficient documentation

## 2019-12-13 DIAGNOSIS — Z5321 Procedure and treatment not carried out due to patient leaving prior to being seen by health care provider: Secondary | ICD-10-CM | POA: Diagnosis not present

## 2019-12-13 DIAGNOSIS — Z3A12 12 weeks gestation of pregnancy: Secondary | ICD-10-CM | POA: Diagnosis not present

## 2019-12-13 DIAGNOSIS — O99891 Other specified diseases and conditions complicating pregnancy: Secondary | ICD-10-CM | POA: Diagnosis present

## 2019-12-13 NOTE — ED Triage Notes (Signed)
Pt states is [redacted] weeks pregnant with her first pregnancy and for the last 2 days she has felt light headed. Pt reports last time she felt like this she was dehydrated. Pt denies any concerns with the pregnancy or pain

## 2019-12-13 NOTE — ED Notes (Signed)
Pt reported to front desk hat she was leaving.

## 2019-12-20 LAB — POCT PREGNANCY, URINE: Preg Test, Ur: POSITIVE — AB

## 2020-03-23 ENCOUNTER — Observation Stay
Admission: EM | Admit: 2020-03-23 | Discharge: 2020-03-24 | Disposition: A | Discharge status: Home / Self Care | Payer: Medicaid Other | Attending: Obstetrics and Gynecology | Admitting: Obstetrics and Gynecology

## 2020-03-23 ENCOUNTER — Other Ambulatory Visit: Payer: Self-pay

## 2020-03-23 DIAGNOSIS — Z6838 Body mass index (BMI) 38.0-38.9, adult: Secondary | ICD-10-CM | POA: Insufficient documentation

## 2020-03-23 DIAGNOSIS — O26892 Other specified pregnancy related conditions, second trimester: Secondary | ICD-10-CM | POA: Insufficient documentation

## 2020-03-23 DIAGNOSIS — M545 Low back pain: Secondary | ICD-10-CM | POA: Insufficient documentation

## 2020-03-23 DIAGNOSIS — O99212 Obesity complicating pregnancy, second trimester: Secondary | ICD-10-CM | POA: Insufficient documentation

## 2020-03-23 DIAGNOSIS — O368120 Decreased fetal movements, second trimester, not applicable or unspecified: Principal | ICD-10-CM | POA: Insufficient documentation

## 2020-03-23 DIAGNOSIS — R109 Unspecified abdominal pain: Secondary | ICD-10-CM | POA: Insufficient documentation

## 2020-03-23 DIAGNOSIS — O99512 Diseases of the respiratory system complicating pregnancy, second trimester: Secondary | ICD-10-CM | POA: Insufficient documentation

## 2020-03-23 DIAGNOSIS — E669 Obesity, unspecified: Secondary | ICD-10-CM | POA: Insufficient documentation

## 2020-03-23 DIAGNOSIS — O368130 Decreased fetal movements, third trimester, not applicable or unspecified: Secondary | ICD-10-CM | POA: Diagnosis present

## 2020-03-23 DIAGNOSIS — O99891 Other specified diseases and conditions complicating pregnancy: Secondary | ICD-10-CM | POA: Insufficient documentation

## 2020-03-23 DIAGNOSIS — Z3A26 26 weeks gestation of pregnancy: Secondary | ICD-10-CM | POA: Insufficient documentation

## 2020-03-23 DIAGNOSIS — J45909 Unspecified asthma, uncomplicated: Secondary | ICD-10-CM | POA: Insufficient documentation

## 2020-03-23 DIAGNOSIS — O24112 Pre-existing diabetes mellitus, type 2, in pregnancy, second trimester: Secondary | ICD-10-CM | POA: Insufficient documentation

## 2020-03-23 NOTE — OB Triage Note (Signed)
Pt G1P0 [redacted]w[redacted]d presents to [facility] through ED w/ c/o abdominal pain and decreased fetal movement. Abdominal pain started this evening and she states she hasn't felt baby move since yesterday. Reports no bleeding or LOF. VSS. Monitors applied and assessing. FHT 160 at 2349.

## 2020-03-24 DIAGNOSIS — O26892 Other specified pregnancy related conditions, second trimester: Secondary | ICD-10-CM | POA: Diagnosis not present

## 2020-03-24 DIAGNOSIS — O368130 Decreased fetal movements, third trimester, not applicable or unspecified: Secondary | ICD-10-CM | POA: Diagnosis present

## 2020-03-24 DIAGNOSIS — O24112 Pre-existing diabetes mellitus, type 2, in pregnancy, second trimester: Secondary | ICD-10-CM | POA: Diagnosis not present

## 2020-03-24 DIAGNOSIS — E669 Obesity, unspecified: Secondary | ICD-10-CM | POA: Diagnosis not present

## 2020-03-24 DIAGNOSIS — R109 Unspecified abdominal pain: Secondary | ICD-10-CM | POA: Diagnosis not present

## 2020-03-24 DIAGNOSIS — O99891 Other specified diseases and conditions complicating pregnancy: Secondary | ICD-10-CM | POA: Diagnosis not present

## 2020-03-24 DIAGNOSIS — J45909 Unspecified asthma, uncomplicated: Secondary | ICD-10-CM | POA: Diagnosis not present

## 2020-03-24 DIAGNOSIS — Z3A26 26 weeks gestation of pregnancy: Secondary | ICD-10-CM | POA: Diagnosis not present

## 2020-03-24 DIAGNOSIS — O99212 Obesity complicating pregnancy, second trimester: Secondary | ICD-10-CM | POA: Diagnosis not present

## 2020-03-24 DIAGNOSIS — Z6838 Body mass index (BMI) 38.0-38.9, adult: Secondary | ICD-10-CM | POA: Diagnosis not present

## 2020-03-24 DIAGNOSIS — O99512 Diseases of the respiratory system complicating pregnancy, second trimester: Secondary | ICD-10-CM | POA: Diagnosis not present

## 2020-03-24 DIAGNOSIS — M545 Low back pain: Secondary | ICD-10-CM | POA: Diagnosis not present

## 2020-03-24 DIAGNOSIS — O368120 Decreased fetal movements, second trimester, not applicable or unspecified: Secondary | ICD-10-CM | POA: Diagnosis present

## 2020-03-24 LAB — URINALYSIS, COMPLETE (UACMP) WITH MICROSCOPIC
Bacteria, UA: NONE SEEN
Bilirubin Urine: NEGATIVE
Glucose, UA: NEGATIVE mg/dL
Hgb urine dipstick: NEGATIVE
Ketones, ur: NEGATIVE mg/dL
Leukocytes,Ua: NEGATIVE
Nitrite: NEGATIVE
Protein, ur: NEGATIVE mg/dL
Specific Gravity, Urine: 1.011 (ref 1.005–1.030)
pH: 6 (ref 5.0–8.0)

## 2020-03-24 MED ORDER — ACETAMINOPHEN 325 MG PO TABS: 650.0000 mg | ORAL_TABLET | ORAL | Status: DC | PRN

## 2020-03-24 NOTE — Discharge Summary (Signed)
Erica Pierce is a 44 y.o. female. She is at [redacted]w[redacted]d gestation. No LMP recorded. Patient is pregnant. Estimated Date of Delivery: 06/27/20  Prenatal care site: [facility]   Current pregnancy complicated by:  1. Obesity, BMI 38 2. Pre-existing type 2 DM, on Metformin 3. Hx asthma 4. Advanced maternal age primigravida 5. Recent UTI being treated with keflex, 8/31.   Chief complaint: decreased fetal movement x 1 day and sharp abdominal pain with constant low back pain today.    S: Resting comfortably. no CTX, no VB.no LOF,  Active fetal movement. Denies: HA, visual changes, SOB, or RUQ/epigastric pain  Maternal Medical History:   Past Medical History:  Diagnosis Date  . Asthma     Past Surgical History:  Procedure Laterality Date  . CHOLECYSTECTOMY      Allergies  Allergen Reactions  . Sulfa Antibiotics   . Sulfur     Prior to Admission medications   Medication Sig Start Date End Date Taking? Authorizing Provider  metFORMIN (GLUCOPHAGE) 500 MG tablet Take by mouth 2 (two) times daily with a meal.   Yes [provider]  Prenatal Vit-Fe Fumarate-FA (PRENATAL MULTIVITAMIN) TABS tablet Take 1 tablet by mouth daily at 12 noon.   Yes [provider]  cyclobenzaprine (FLEXERIL) 10 MG tablet Take 1 tablet (10 mg total) 3 (three) times daily as needed by mouth for muscle spasms. Patient not taking: Reported on 03/23/2020 05/29/17   Triplett, Rulon Eisenmenger B, FNP  naproxen (NAPROSYN) 500 MG tablet Take 1 tablet (500 mg total) 2 (two) times daily with a meal by mouth. Patient not taking: Reported on 03/23/2020 05/29/17   Kem Boroughs B, FNP  penicillin v potassium (VEETID) 250 MG tablet Take 1 tablet (250 mg total) by mouth 4 (four) times daily. Patient not taking: Reported on 03/23/2020 08/15/17   Jene Every, MD      Social History: She  reports that she has been smoking. She has never used smokeless tobacco. She reports that she does not drink alcohol.  Family History:  no  history of gyn cancers  Review of Systems: A full review of systems was performed and negative except as noted in the HPI.     O:  BP 127/62 (BP Location: Left Leg)   Pulse 77   Temp 98.2 F (36.8 C) (Oral)   Resp 18   Ht 5\' 6"  (1.676 m)   Wt 114.8 kg   BMI 40.84 kg/m  No results found for this or any previous visit (from the past 48 hour(s)).   Constitutional: NAD, AAOx3  HE/ENT: extraocular movements grossly intact, moist mucous membranes CV: RRR PULM: nl respiratory effort, CTABL     Abd: gravid, non-tender, non-distended, soft      Ext: Non-tender, Nonedematous   Psych: mood appropriate, speech normal Pelvic: deferred.  Fetal  monitoring: Cat I Appropriate for GA Baseline: 140bpm Variability: moderate Accelerations: present x >2, 10*10bpm Decelerations absent  Toco: no UCs noted.     A/P: 44 y.o. [redacted]w[redacted]d here for antenatal surveillance for decreased fetal movement.   Principle Diagnosis:  High risk pregnancy in third trimester   Preterm labor: not present.   Fetal Wellbeing: Reassuring Cat 1 tracing for GA.  D/c home stable, precautions reviewed, follow-up as scheduled.    [redacted]w[redacted]d, CNM 03/24/2020  12:34 AM

## 2020-04-10 ENCOUNTER — Observation Stay
Admission: EM | Admit: 2020-04-10 | Discharge: 2020-04-10 | Disposition: A | Discharge status: Home / Self Care | Payer: Medicaid Other

## 2020-04-10 ENCOUNTER — Other Ambulatory Visit: Payer: Self-pay

## 2020-04-10 ENCOUNTER — Encounter: Payer: Self-pay | Admitting: Obstetrics and Gynecology

## 2020-04-10 DIAGNOSIS — Z3A28 28 weeks gestation of pregnancy: Secondary | ICD-10-CM | POA: Diagnosis not present

## 2020-04-10 DIAGNOSIS — J45909 Unspecified asthma, uncomplicated: Secondary | ICD-10-CM | POA: Insufficient documentation

## 2020-04-10 DIAGNOSIS — Z7984 Long term (current) use of oral hypoglycemic drugs: Secondary | ICD-10-CM | POA: Insufficient documentation

## 2020-04-10 DIAGNOSIS — O24113 Pre-existing diabetes mellitus, type 2, in pregnancy, third trimester: Secondary | ICD-10-CM | POA: Insufficient documentation

## 2020-04-10 DIAGNOSIS — Z87891 Personal history of nicotine dependence: Secondary | ICD-10-CM | POA: Insufficient documentation

## 2020-04-10 DIAGNOSIS — O368130 Decreased fetal movements, third trimester, not applicable or unspecified: Secondary | ICD-10-CM | POA: Diagnosis present

## 2020-04-10 DIAGNOSIS — Z79899 Other long term (current) drug therapy: Secondary | ICD-10-CM | POA: Insufficient documentation

## 2020-04-10 HISTORY — DX: Gestational diabetes mellitus in pregnancy, unspecified control: O24.419

## 2020-04-10 HISTORY — DX: Polycystic ovarian syndrome: E28.2

## 2020-04-10 LAB — WET PREP, GENITAL
Clue Cells Wet Prep HPF POC: NONE SEEN
Sperm: NONE SEEN
Trich, Wet Prep: NONE SEEN
Yeast Wet Prep HPF POC: NONE SEEN

## 2020-04-10 MED ORDER — ACETAMINOPHEN 325 MG PO TABS: 650.0000 mg | ORAL_TABLET | ORAL | Status: DC | PRN

## 2020-04-10 MED ORDER — CALCIUM CARBONATE ANTACID 500 MG PO CHEW: 2.0000 | CHEWABLE_TABLET | ORAL | Status: DC | PRN

## 2020-04-10 NOTE — OB Triage Note (Signed)
NO fetal movement x 2 days. Placed on monitor, audible fetal movement noted. Erica Pierce

## 2020-04-10 NOTE — Discharge Summary (Signed)
Erica Pierce is a 44 y.o. female. She is at [redacted]w[redacted]d gestation. Patient's last menstrual period was 09/21/2019 (exact date). Estimated Date of Delivery: 06/27/20   Prenatal care site: [facility]   Chief Complaint: decreased fetal movement.  Erica Pierce presents today to L&D for decreased fetal movement x 2 days. She also reports vaginal discharge x 2 weeks and recent change in vaginal odor.  Her pregnancy is currently complicated by:  1. Preexisting Type II DM 2. AMA - 44 years old at time of delivery  3. Obesity - pregravid BMI 38.59 4. History of anxiety and depression  5. Asthma    S: Resting comfortably. no CTX, no VB.no LOF,  feeling occasional fetal movement.    Maternal Medical History:  Past Medical Hx:  has a past medical history of Asthma, Chlamydia (1999), Gestational diabetes, and PCOS (polycystic ovarian syndrome).  Past Surgical Hx:  has a past surgical history that includes Cholecystectomy.   Allergies  Allergen Reactions  . Sulfa Antibiotics Hives and Swelling  . Sulfur     Prior to Admission medications   Medication Sig Start Date End Date Taking? Authorizing Provider  albuterol (VENTOLIN HFA) 108 (90 Base) MCG/ACT inhaler Inhale 2 puffs into the lungs every 4 (four) hours as needed for wheezing or shortness of breath.   Yes [provider]  metFORMIN (GLUCOPHAGE) 500 MG tablet Take by mouth 2 (two) times daily with a meal.   Yes [provider]  Prenatal Vit-Fe Fumarate-FA (PRENATAL MULTIVITAMIN) TABS tablet Take 1 tablet by mouth daily at 12 noon.   Yes [provider]  cephALEXin (KEFLEX) 500 MG capsule Take 500 mg by mouth 4 (four) times daily. Patient not taking: Reported on 04/10/2020    [provider]  cyclobenzaprine (FLEXERIL) 10 MG tablet Take 1 tablet (10 mg total) 3 (three) times daily as needed by mouth for muscle spasms. Patient not taking: Reported on 04/10/2020 05/29/17   Kem Boroughs B, FNP  naproxen (NAPROSYN)  500 MG tablet Take 1 tablet (500 mg total) 2 (two) times daily with a meal by mouth. Patient not taking: Reported on 03/23/2020 05/29/17   Kem Boroughs B, FNP  penicillin v potassium (VEETID) 250 MG tablet Take 1 tablet (250 mg total) by mouth 4 (four) times daily. Patient not taking: Reported on 03/23/2020 08/15/17   Jene Every, MD     Social History: She  reports that she has quit smoking. She has never used smokeless tobacco. She reports that she does not drink alcohol.  Family History: family history includes Cancer in her paternal grandfather and paternal grandmother; Heart failure in her mother.   Review of Systems: A full review of systems was performed and negative except as noted in the HPI.     O:  BP (!) 103/58 (BP Location: Right Arm)   Pulse 93   Temp 98.4 F (36.9 C) (Oral)   Resp 18   Ht 5\' 6"  (1.676 m)   Wt 117.5 kg   LMP 09/21/2019 (Exact Date)   BMI 41.80 kg/m  Results for orders placed or performed during the hospital encounter of 04/10/20 (from the past 48 hour(s))  Wet prep, genital   Collection Time: 04/10/20  1:23 PM   Specimen: Vaginal  Result Value Ref Range   Yeast Wet Prep HPF POC NONE SEEN NONE SEEN   Trich, Wet Prep NONE SEEN NONE SEEN   Clue Cells Wet Prep HPF POC NONE SEEN NONE SEEN   WBC, Wet Prep HPF  POC MODERATE (A) NONE SEEN   Sperm NONE SEEN      Constitutional: NAD, AAOx3  HE/ENT: extraocular movements grossly intact, moist mucous membranes CV: RRR PULM: nl respiratory effort, CTABL     Abd: gravid, non-tender, non-distended, soft      Ext: Non-tender, Nonedmeatous   Psych: mood appropriate, speech normal Pelvic: no erythema, normal physiologic discharge present    NST: Baseline: 140 Variability: moderate Accelerations present x >2 Decelerations absent Toco: quiet  Time   A/P: 44 y.o. [redacted]w[redacted]d with decreased fetal movement and antepartum surveillance.   Labor: not present.   Fetal Wellbeing: Reassuring Cat 1  tracing.  NST appropriate for gestational age    Wet prep negative, discharge c/w normal physiologic discharge   D/c home stable, precautions reviewed, follow-up as scheduled.   ----- [name], CNM Certified Nurse Midwife [facility]

## 2020-06-22 ENCOUNTER — Emergency Department
Admission: EM | Admit: 2020-06-22 | Discharge: 2020-06-22 | Disposition: A | Discharge status: Home / Self Care | Payer: Medicaid Other | Attending: Emergency Medicine | Admitting: Emergency Medicine

## 2020-06-22 ENCOUNTER — Encounter: Payer: Self-pay | Admitting: Emergency Medicine

## 2020-06-22 ENCOUNTER — Other Ambulatory Visit: Payer: Self-pay

## 2020-06-22 ENCOUNTER — Emergency Department: Payer: Medicaid Other

## 2020-06-22 ENCOUNTER — Ambulatory Visit: Admission: RE | Admit: 2020-06-22 | Payer: Medicaid Other | Source: Ambulatory Visit

## 2020-06-22 DIAGNOSIS — Z5321 Procedure and treatment not carried out due to patient leaving prior to being seen by health care provider: Secondary | ICD-10-CM | POA: Insufficient documentation

## 2020-06-22 DIAGNOSIS — R519 Headache, unspecified: Secondary | ICD-10-CM | POA: Diagnosis not present

## 2020-06-22 LAB — CBC
HCT: 30.3 % — ABNORMAL LOW (ref 36.0–46.0)
Hemoglobin: 9.2 g/dL — ABNORMAL LOW (ref 12.0–15.0)
MCH: 20.2 pg — ABNORMAL LOW (ref 26.0–34.0)
MCHC: 30.4 g/dL (ref 30.0–36.0)
MCV: 66.4 fL — ABNORMAL LOW (ref 80.0–100.0)
Platelets: 416 10*3/uL — ABNORMAL HIGH (ref 150–400)
RBC: 4.56 MIL/uL (ref 3.87–5.11)
RDW: 17.8 % — ABNORMAL HIGH (ref 11.5–15.5)
WBC: 6.4 10*3/uL (ref 4.0–10.5)
nRBC: 0 % (ref 0.0–0.2)

## 2020-06-22 LAB — COMPREHENSIVE METABOLIC PANEL
ALT: 19 U/L (ref 0–44)
AST: 21 U/L (ref 15–41)
Albumin: 2.8 g/dL — ABNORMAL LOW (ref 3.5–5.0)
Alkaline Phosphatase: 78 U/L (ref 38–126)
Anion gap: 8 (ref 5–15)
BUN: 8 mg/dL (ref 6–20)
CO2: 25 mmol/L (ref 22–32)
Calcium: 8.9 mg/dL (ref 8.9–10.3)
Chloride: 106 mmol/L (ref 98–111)
Creatinine, Ser: 0.79 mg/dL (ref 0.44–1.00)
GFR, Estimated: >60 mL/min (ref >60)
Glucose, Bld: 108 mg/dL — ABNORMAL HIGH (ref 70–99)
Potassium: 3.9 mmol/L (ref 3.5–5.1)
Sodium: 139 mmol/L (ref 135–145)
Total Bilirubin: 0.5 mg/dL (ref 0.3–1.2)
Total Protein: 6.3 g/dL — ABNORMAL LOW (ref 6.5–8.1)

## 2020-06-22 LAB — PROTEIN / CREATININE RATIO, URINE
Creatinine, Urine: 190 mg/dL
Protein Creatinine Ratio: 0.05 mg/mg{Cre} (ref 0.00–0.15)
Total Protein, Urine: 10 mg/dL

## 2020-06-22 NOTE — ED Notes (Signed)
First Nurse: patient brought in by ems from home. Patient with complaint of headache that woke her up out of her sleep. Patient had a c-section on 05/20/20. Per ems bp 150/80.

## 2020-06-22 NOTE — ED Triage Notes (Signed)
Pt to ED from home c/o headache today, woke up with it.  Denies n/v/d.  States had C-section on 11/30.  Pt states pain is gone at this time, did not take any medications.  States she came in because she hadn't felt this pain before and was worried since having recent c-section and wanted to be checked out.  Pt A&Ox4, chest rise even and unlabored, skin WNL, using phone in triage, in NAD at this time.

## 2020-07-15 ENCOUNTER — Emergency Department
Admission: EM | Admit: 2020-07-15 | Discharge: 2020-07-15 | Disposition: A | Discharge status: Home / Self Care | Payer: Medicaid Other | Attending: Emergency Medicine | Admitting: Emergency Medicine

## 2020-07-15 ENCOUNTER — Encounter: Payer: Self-pay | Admitting: Emergency Medicine

## 2020-07-15 ENCOUNTER — Other Ambulatory Visit: Payer: Self-pay

## 2020-07-15 DIAGNOSIS — Z5321 Procedure and treatment not carried out due to patient leaving prior to being seen by health care provider: Secondary | ICD-10-CM | POA: Diagnosis not present

## 2020-07-15 DIAGNOSIS — M545 Low back pain, unspecified: Secondary | ICD-10-CM | POA: Insufficient documentation

## 2020-07-15 NOTE — ED Triage Notes (Signed)
Pt walked up to the front desk staff and handed over her BP cuff and advised that she was leaving.

## 2020-07-15 NOTE — ED Triage Notes (Signed)
Pt comes into the ED via POV c/o lower back pain that has shooting pain through the buttock and down the legs.  Pt has h/o sciatica and states this feels similar but the pain isnt letting up and she isnt able to do her normal functions of living.  Pt is ambulatory to triage at this time.  Pt states she has been taking flexeril with no relief.

## 2020-07-15 NOTE — ED Notes (Signed)
Pt reported to front desk staff that was leaving and left her BP cuff

## 2022-01-01 ENCOUNTER — Encounter: Payer: Self-pay | Admitting: Emergency Medicine

## 2022-01-01 ENCOUNTER — Ambulatory Visit
Admission: EM | Admit: 2022-01-01 | Discharge: 2022-01-01 | Disposition: A | Discharge status: Home / Self Care | Payer: Medicaid Other | Attending: Student | Admitting: Student

## 2022-01-01 DIAGNOSIS — N760 Acute vaginitis: Secondary | ICD-10-CM | POA: Insufficient documentation

## 2022-01-01 HISTORY — DX: Essential (primary) hypertension: I10

## 2022-01-01 LAB — URINALYSIS, ROUTINE W REFLEX MICROSCOPIC
Bilirubin Urine: NEGATIVE
Glucose, UA: NEGATIVE mg/dL
Ketones, ur: NEGATIVE mg/dL
Leukocytes,Ua: NEGATIVE
Nitrite: NEGATIVE
Protein, ur: NEGATIVE mg/dL
Specific Gravity, Urine: 1.025 (ref 1.005–1.030)
pH: 5.5 (ref 5.0–8.0)

## 2022-01-01 LAB — URINALYSIS, MICROSCOPIC (REFLEX)

## 2022-01-01 LAB — WET PREP, GENITAL
Clue Cells Wet Prep HPF POC: NONE SEEN
Sperm: NONE SEEN
Trich, Wet Prep: NONE SEEN
WBC, Wet Prep HPF POC: >10 — AB (ref <10)

## 2022-01-01 LAB — PREGNANCY, URINE: Preg Test, Ur: NEGATIVE

## 2022-01-01 MED ORDER — METRONIDAZOLE 500 MG PO TABS: 500.0000 mg | ORAL_TABLET | Freq: Two times a day (BID) | ORAL | 0 refills | Status: DC

## 2022-01-01 MED ORDER — FLUCONAZOLE 150 MG PO TABS: 150.0000 mg | ORAL_TABLET | Freq: Every day | ORAL | 0 refills | Status: DC

## 2022-01-01 NOTE — ED Provider Notes (Signed)
[facility]    CSN: [id number] Arrival date & time: 01/01/22  1128      History   Chief Complaint Chief Complaint  Patient presents with   Dysuria   Back Pain    HPI Erica Pierce is a 46 y.o. female presenting with urinary symptoms for 2 weeks, with new onset of lower back pain.  History of PCOS, right ovarian cyst, chlamydia, BV, yeast.  She describes external vaginal irritation with dysuria and frequency.  She became concerned with the onset of lower back pain that is worse with movement, there is no flank pain.  She denies seeing any vaginal discharge.  Denies new partners or STI risk.  HPI  Past Medical History:  Diagnosis Date   Asthma    Chlamydia 1999   Gestational diabetes    Hypertension    PCOS (polycystic ovarian syndrome)     Patient Active Problem List   Diagnosis Date Noted   Decreased fetal movement affecting management of pregnancy in third trimester 03/24/2020    Past Surgical History:  Procedure Laterality Date   CHOLECYSTECTOMY      OB History     Gravida  1   Para      Term      Preterm      AB      Living         SAB      IAB      Ectopic      Multiple      Live Births               Home Medications    Prior to Admission medications   Medication Sig Start Date End Date Taking? Authorizing Provider  fluconazole (DIFLUCAN) 150 MG tablet Take 1 tablet (150 mg total) by mouth daily. -For your yeast infection, start the Diflucan (fluconazole)- Take one pill today (day 1). If you're still having symptoms in 3 days, take the second pill. 01/01/22  Yes Rhys Martini, PA-C  metFORMIN (GLUCOPHAGE) 500 MG tablet Take by mouth 2 (two) times daily with a meal.   Yes [provider]  metroNIDAZOLE (FLAGYL) 500 MG tablet Take 1 tablet (500 mg total) by mouth 2 (two) times daily. Avoid alcohol while taking this medication and for 2 days after 01/01/22  Yes Rhys Martini, PA-C  albuterol (VENTOLIN HFA) 108  (90 Base) MCG/ACT inhaler Inhale 2 puffs into the lungs every 4 (four) hours as needed for wheezing or shortness of breath.    [provider]    Family History Family History  Problem Relation Age of Onset   Heart failure Mother    Cancer Paternal Grandmother    Cancer Paternal Grandfather     Social History Social History   Tobacco Use   Smoking status: Every Day    Types: Cigarettes   Smokeless tobacco: Never  Vaping Use   Vaping Use: Never used  Substance Use Topics   Alcohol use: No     Allergies   Elemental sulfur and Sulfa antibiotics   Review of Systems Review of Systems  Constitutional:  Negative for appetite change, chills, diaphoresis and fever.  Respiratory:  Negative for shortness of breath.   Cardiovascular:  Negative for chest pain.  Gastrointestinal:  Negative for abdominal pain, blood in stool, constipation, diarrhea, nausea and vomiting.  Genitourinary:  Positive for dysuria. Negative for decreased urine volume, difficulty urinating, flank pain, frequency, genital sores, hematuria, urgency and vaginal discharge.  Musculoskeletal:  Negative for back pain.  Neurological:  Negative for dizziness, weakness and light-headedness.  All other systems reviewed and are negative.    Physical Exam Triage Vital Signs ED Triage Vitals  Enc Vitals Group     BP 01/01/22 1148 (!) 127/108     Pulse Rate 01/01/22 1148 71     Resp 01/01/22 1148 14     Temp 01/01/22 1148 98.6 F (37 C)     Temp Source 01/01/22 1148 Oral     SpO2 01/01/22 1148 100 %     Weight 01/01/22 1146 252 lb (114.3 kg)     Height 01/01/22 1146 5\' 6"  (1.676 m)     Head Circumference --      Peak Flow --      Pain Score 01/01/22 1146 8     Pain Loc --      Pain Edu? --      Excl. in GC? --    No data found.  Updated Vital Signs BP 127/73 (BP Location: Left Arm)   Pulse 71   Temp 98.6 F (37 C) (Oral)   Resp 14   Ht 5\' 6"  (1.676 m)   Wt 252 lb (114.3 kg)   LMP 12/09/2021    SpO2 100%   Breastfeeding No   BMI 40.67 kg/m   Visual Acuity Right Eye Distance:   Left Eye Distance:   Bilateral Distance:    Right Eye Near:   Left Eye Near:    Bilateral Near:     Physical Exam Vitals reviewed.  Constitutional:      General: She is not in acute distress.    Appearance: Normal appearance. She is not ill-appearing.  HENT:     Head: Normocephalic and atraumatic.     Mouth/Throat:     Mouth: Mucous membranes are moist.     Comments: Moist mucous membranes Eyes:     Extraocular Movements: Extraocular movements intact.     Pupils: Pupils are equal, round, and reactive to light.  Cardiovascular:     Rate and Rhythm: Normal rate and regular rhythm.     Heart sounds: Normal heart sounds.  Pulmonary:     Effort: Pulmonary effort is normal.     Breath sounds: Normal breath sounds. No wheezing, rhonchi or rales.  Abdominal:     General: Bowel sounds are normal. There is no distension.     Palpations: Abdomen is soft. There is no mass.     Tenderness: There is no abdominal tenderness. There is no right CVA tenderness, left CVA tenderness, guarding or rebound. Negative signs include Murphy's sign and Rovsing's sign.  Genitourinary:    Comments: deferred Skin:    General: Skin is warm.     Capillary Refill: Capillary refill takes less than 2 seconds.     Comments: Good skin turgor  Neurological:     General: No focal deficit present.     Mental Status: She is alert and oriented to person, place, and time.  Psychiatric:        Mood and Affect: Mood normal.        Behavior: Behavior normal.      UC Treatments / Results  Labs (all labs ordered are listed, but only abnormal results are displayed) Labs Reviewed  WET PREP, GENITAL - Abnormal; Notable for the following components:      Result Value   Yeast Wet Prep HPF POC PRESENT (*)    WBC, Wet Prep HPF POC >10 (*)  All other components within normal limits  URINALYSIS, ROUTINE W REFLEX MICROSCOPIC  - Abnormal; Notable for the following components:   Hgb urine dipstick TRACE (*)    All other components within normal limits  URINALYSIS, MICROSCOPIC (REFLEX) - Abnormal; Notable for the following components:   Bacteria, UA FEW (*)    All other components within normal limits  PREGNANCY, URINE    EKG   Radiology No results found.  Procedures Procedures (including critical care time)  Medications Ordered in UC Medications - No data to display  Initial Impression / Assessment and Plan / UC Course  I have reviewed the triage vital signs and the nursing notes.  Pertinent labs & imaging results that were available during my care of the patient were reviewed by me and considered in my medical decision making (see chart for details).     This patient is a very pleasant 46 y.o. year old female presenting with vaginitis. Afebrile, nontachycardic, no reproducible abd pain or CVAT.  UA with trace blood, negative nitrite, negative leuk. Did not send culture.  U-preg negative. Wet prep with yeast.   Given history recurrent BV, patient prefers to treat for BV and yeast. Diflucan and flagyl sent as below.   ED return precautions discussed. Patient verbalizes understanding and agreement.     Final Clinical Impressions(s) / UC Diagnoses   Final diagnoses:  Vaginitis and vulvovaginitis     Discharge Instructions      -For bacterial vaginosis, start the antibiotic-Flagyl (metronidazole), 2 pills daily for 7 days.  You can take this with food if you have a sensitive stomach.  Avoid alcohol while taking this medication and for 2 days after as this will cause severe nausea and vomiting. -For your yeast infection, start the Diflucan (fluconazole)- Take one pill today (day 1). If you're still having symptoms in 3 days, take the second pill.     ED Prescriptions     Medication Sig Dispense Auth. Provider   metroNIDAZOLE (FLAGYL) 500 MG tablet Take 1 tablet (500 mg total) by mouth 2  (two) times daily. Avoid alcohol while taking this medication and for 2 days after 14 tablet Rhys Martini, PA-C   fluconazole (DIFLUCAN) 150 MG tablet Take 1 tablet (150 mg total) by mouth daily. -For your yeast infection, start the Diflucan (fluconazole)- Take one pill today (day 1). If you're still having symptoms in 3 days, take the second pill. 2 tablet Rhys Martini, PA-C      PDMP not reviewed this encounter.   Rhys Martini, PA-C 01/01/22 1344

## 2022-01-01 NOTE — Discharge Instructions (Addendum)
-  For bacterial vaginosis, start the antibiotic-Flagyl (metronidazole), 2 pills daily for 7 days.  You can take this with food if you have a sensitive stomach.  Avoid alcohol while taking this medication and for 2 days after as this will cause severe nausea and vomiting. -For your yeast infection, start the Diflucan (fluconazole)- Take one pill today (day 1). If you're still having symptoms in 3 days, take the second pill.  

## 2022-01-01 NOTE — ED Triage Notes (Signed)
Patient c/o dysuria and urinary frequency that started 2 weeks ago. Patient now c/o lower back pain.

## 2022-01-06 ENCOUNTER — Ambulatory Visit (INDEPENDENT_AMBULATORY_CARE_PROVIDER_SITE_OTHER): Payer: Medicaid Other

## 2022-01-06 ENCOUNTER — Ambulatory Visit
Admission: EM | Admit: 2022-01-06 | Discharge: 2022-01-06 | Disposition: A | Discharge status: Home / Self Care | Payer: Medicaid Other | Attending: Emergency Medicine | Admitting: Emergency Medicine

## 2022-01-06 DIAGNOSIS — W19XXXA Unspecified fall, initial encounter: Secondary | ICD-10-CM

## 2022-01-06 DIAGNOSIS — M79671 Pain in right foot: Secondary | ICD-10-CM | POA: Diagnosis not present

## 2022-01-06 MED ORDER — PREDNISONE 10 MG (21) PO TBPK: ORAL_TABLET | ORAL | 0 refills | Status: DC

## 2022-01-06 NOTE — Discharge Instructions (Signed)
X-rays did not demonstrate any broken bones but it did mistreat some degeneration in the joints of your toes (arthritis).  Take the prednisone according to the package instructions as needed for the pain.  Keep your right foot elevated is much as possible.  You can apply moist heat to your foot by soaking in a tub for 20 minutes at a time 2-3 times a day to help improve blood flow and aid in pain relief.  Try alternating her shoes on every other day basis to see if this helps with your pain as well.  If your symptoms do not improve I recommend following up with podiatry.

## 2022-01-06 NOTE — ED Triage Notes (Signed)
Patient presents to UC for falling through her back porch 2 weeks ago. Patient c.I right foot and ankle pain.

## 2022-01-06 NOTE — ED Provider Notes (Signed)
[facility]    CSN: [id number] Arrival date & time: 01/06/22  1349      History   Chief Complaint No chief complaint on file.   HPI Joniah Bednarski is a 46 y.o. female.   HPI  46 year old female here for evaluation of right foot pain.  Patient reports that she has been experiencing pain in the third fourth and fifth toe of her right foot as well as lateral right ankle and midfoot for the past 3 days.  She denies any recent injury but she does state that she fell through her back porch 2 weeks ago.  She does not have any pain at the time and she has not had any new injuries.  She denies any numbness or tingling in her toes and she denies any bruising.  She says her foot is swollen.  She has had no new changes in footwear or started any new exercise routines.  She does stand on her feet on concrete for long periods of time at work.  She is able to bear weight but it does cause her pain.  Past Medical History:  Diagnosis Date   Asthma    Chlamydia 1999   Gestational diabetes    Hypertension    PCOS (polycystic ovarian syndrome)     Patient Active Problem List   Diagnosis Date Noted   Decreased fetal movement affecting management of pregnancy in third trimester 03/24/2020    Past Surgical History:  Procedure Laterality Date   CHOLECYSTECTOMY      OB History     Gravida  1   Para      Term      Preterm      AB      Living         SAB      IAB      Ectopic      Multiple      Live Births               Home Medications    Prior to Admission medications   Medication Sig Start Date End Date Taking? Authorizing Provider  albuterol (VENTOLIN HFA) 108 (90 Base) MCG/ACT inhaler Inhale 2 puffs into the lungs every 4 (four) hours as needed for wheezing or shortness of breath.   Yes [provider]  metFORMIN (GLUCOPHAGE) 500 MG tablet Take by mouth 2 (two) times daily with a meal.   Yes [provider]  predniSONE (STERAPRED  UNI-PAK 21 TAB) 10 MG (21) TBPK tablet Take 6 tablets on day 1, 5 tablets day 2, 4 tablets day 3, 3 tablets day 4, 2 tablets day 5, 1 tablet day 6 01/06/22  Yes Becky Augusta, NP    Family History Family History  Problem Relation Age of Onset   Heart failure Mother    Cancer Paternal Grandmother    Cancer Paternal Grandfather     Social History Social History   Tobacco Use   Smoking status: Every Day    Types: Cigarettes   Smokeless tobacco: Never  Vaping Use   Vaping Use: Never used  Substance Use Topics   Alcohol use: No     Allergies   Elemental sulfur and Sulfa antibiotics   Review of Systems Review of Systems  Musculoskeletal:  Positive for arthralgias and joint swelling.  Skin:  Negative for color change.  Neurological:  Negative for numbness.  Hematological: Negative.   Psychiatric/Behavioral: Negative.       Physical Exam  Triage Vital Signs ED Triage Vitals  Enc Vitals Group     BP --      Pulse --      Resp --      Temp --      Temp src --      SpO2 --      Weight 01/06/22 1404 252 lb (114.3 kg)     Height 01/06/22 1404 5\' 6"  (1.676 m)     Head Circumference --      Peak Flow --      Pain Score 01/06/22 1403 8     Pain Loc --      Pain Edu? --      Excl. in GC? --    No data found.  Updated Vital Signs BP (!) 151/98 (BP Location: Left Arm)   Pulse 78   Temp 98.6 F (37 C) (Oral)   Ht 5\' 6"  (1.676 m)   Wt 252 lb (114.3 kg)   LMP 12/09/2021   SpO2 100%   BMI 40.67 kg/m   Visual Acuity Right Eye Distance:   Left Eye Distance:   Bilateral Distance:    Right Eye Near:   Left Eye Near:    Bilateral Near:     Physical Exam Vitals and nursing note reviewed.  Constitutional:      Appearance: Normal appearance. She is not ill-appearing.  HENT:     Head: Normocephalic and atraumatic.  Musculoskeletal:        General: Swelling, tenderness and signs of injury present. No deformity. Normal range of motion.  Skin:    General: Skin is  warm and dry.     Capillary Refill: Capillary refill takes less than 2 seconds.     Findings: No bruising or erythema.  Neurological:     General: No focal deficit present.     Mental Status: She is alert and oriented to person, place, and time.     Sensory: No sensory deficit.     Motor: No weakness.  Psychiatric:        Mood and Affect: Mood normal.        Behavior: Behavior normal.        Thought Content: Thought content normal.        Judgment: Judgment normal.      UC Treatments / Results  Labs (all labs ordered are listed, but only abnormal results are displayed) Labs Reviewed - No data to display  EKG   Radiology DG Foot Complete Right  Result Date: 01/06/2022 CLINICAL DATA:  pain in foot. Fall 2 weeks ago EXAM: RIGHT FOOT COMPLETE - 3+ VIEW COMPARISON:  None Available. FINDINGS: There is no evidence of acute fracture. Alignment is normal. There is mild interphalangeal joint degenerative change. Tiny plantar and dorsal calcaneal spurs. IMPRESSION: No evidence of acute fracture in the right foot. Electronically Signed   By: 12/11/2021 M.D.   On: 01/06/2022 14:47    Procedures Procedures (including critical care time)  Medications Ordered in UC Medications - No data to display  Initial Impression / Assessment and Plan / UC Course  I have reviewed the triage vital signs and the nursing notes.  Pertinent labs & imaging results that were available during my care of the patient were reviewed by me and considered in my medical decision making (see chart for details).  Patient is a nontoxic-appearing 46 year old female here for evaluation of right foot pain that has been going on for the last 3 days and  is not in the setting of a recent injury.  2 weeks ago she fell through the floor of her porch and states that she did not have any discomfort at that time.  The pain has developed since then.  On exam patient's right foot and ankle are normal anatomical alignment.  She does  have mild swelling to the dorsal midfoot but no erythema or ecchymosis.  She has full sensation of all of her toes and her cap refill is less than 2 seconds.  She does complain of pain with palpation of the entirety of the third, fourth, and fifth toe, distal metatarsals of the third fourth and fifth toe, lateral midfoot, and the lateral malleolus of the ankle.  No crepitus is appreciated.  Her DP and PT pulses are 2+.  I will obtain radiograph of right foot to look for bony abnormality.  Right foot x-ray independently reviewed and evaluated by me.  Impression: No evidence of fracture or dislocation.  Soft tissues are unremarkable.  Radiology overread is pending. Radiology impression states there is no evidence of acute fracture.  Alignment is normal.  There are mild interphalangeal joint degenerative changes.  I suspect that the patient's the degenerative changes are was causing the pain in her foot.  I will discharge her home on prednisone and given her a work note for today and tomorrow.   Final Clinical Impressions(s) / UC Diagnoses   Final diagnoses:  Right foot pain     Discharge Instructions      X-rays did not demonstrate any broken bones but it did mistreat some degeneration in the joints of your toes (arthritis).  Take the prednisone according to the package instructions as needed for the pain.  Keep your right foot elevated is much as possible.  You can apply moist heat to your foot by soaking in a tub for 20 minutes at a time 2-3 times a day to help improve blood flow and aid in pain relief.  Try alternating her shoes on every other day basis to see if this helps with your pain as well.  If your symptoms do not improve I recommend following up with podiatry.     ED Prescriptions     Medication Sig Dispense Auth. Provider   predniSONE (STERAPRED UNI-PAK 21 TAB) 10 MG (21) TBPK tablet Take 6 tablets on day 1, 5 tablets day 2, 4 tablets day 3, 3 tablets day 4, 2 tablets  day 5, 1 tablet day 6 21 tablet Becky Augusta, NP      PDMP not reviewed this encounter.   Becky Augusta, NP 01/06/22 1453

## 2022-01-29 ENCOUNTER — Ambulatory Visit
Admission: EM | Admit: 2022-01-29 | Discharge: 2022-01-29 | Disposition: A | Discharge status: Home / Self Care | Payer: Medicaid Other | Attending: Emergency Medicine | Admitting: Emergency Medicine

## 2022-01-29 DIAGNOSIS — B3731 Acute candidiasis of vulva and vagina: Secondary | ICD-10-CM | POA: Insufficient documentation

## 2022-01-29 LAB — URINALYSIS, ROUTINE W REFLEX MICROSCOPIC
Bilirubin Urine: NEGATIVE
Glucose, UA: NEGATIVE mg/dL
Ketones, ur: NEGATIVE mg/dL
Leukocytes,Ua: NEGATIVE
Nitrite: NEGATIVE
Protein, ur: NEGATIVE mg/dL
Specific Gravity, Urine: 1.02 (ref 1.005–1.030)
pH: 6.5 (ref 5.0–8.0)

## 2022-01-29 LAB — URINALYSIS, MICROSCOPIC (REFLEX)

## 2022-01-29 LAB — WET PREP, GENITAL
Clue Cells Wet Prep HPF POC: NONE SEEN
Sperm: NONE SEEN
Trich, Wet Prep: NONE SEEN
WBC, Wet Prep HPF POC: <10 — AB (ref <10)

## 2022-01-29 MED ORDER — FLUCONAZOLE 150 MG PO TABS: 150.0000 mg | ORAL_TABLET | ORAL | 0 refills | Status: DC | PRN

## 2022-01-29 NOTE — ED Provider Notes (Signed)
[facility] [place]   Name: Erica Pierce DOB: 02-16-1976 MRN: [id number] CSN: [id number] PCP: Pcp, No  Arrival date and time:  01/29/22 1429  Chief Complaint:  Urinary Tract Infection   NOTE: Prior to seeing the patient today, I have reviewed the triage nursing documentation and vital signs. Clinical staff has updated patient's PMH/PSHx, current medication list, and drug allergies/intolerances to ensure comprehensive history available to assist in medical decision making.   History:   HPI: Erica Pierce is a 46 y.o. female who presents today with complaints of malodorous urine, increased urine frequency.  Patient was treated for BV and yeast approximately 1 month ago for similar symptoms.  She finished the meds as directed.  Symptoms returned 7 days ago.  She denies any fevers, chills, body aches or GI symptoms.  No concerns of STIs.  Of note, patient was prescribed a prednisone taper pack on June 22 for right foot pain.   Past Medical History:  Diagnosis Date   Asthma    Chlamydia 1999   Gestational diabetes    Hypertension    PCOS (polycystic ovarian syndrome)     Past Surgical History:  Procedure Laterality Date   CESAREAN SECTION     CHOLECYSTECTOMY      Family History  Problem Relation Age of Onset   Heart failure Mother    Cancer Paternal Grandmother    Cancer Paternal Grandfather     Social History   Tobacco Use   Smoking status: Every Day    Types: Cigarettes   Smokeless tobacco: Never  Vaping Use   Vaping Use: Never used  Substance Use Topics   Alcohol use: No    Patient Active Problem List   Diagnosis Date Noted   Decreased fetal movement affecting management of pregnancy in third trimester 03/24/2020    Home Medications:    Current Meds  Medication Sig   metFORMIN (GLUCOPHAGE) 500 MG tablet Take by mouth 2 (two) times daily with a meal.   NIFEdipine (PROCARDIA-XL/NIFEDICAL-XL) 30 MG 24 hr tablet Take 30 mg by mouth  daily.    Allergies:   Elemental sulfur and Sulfa antibiotics  Review of Systems (ROS): Review of Systems  Constitutional:  Negative for chills, fatigue and fever.  Gastrointestinal:  Negative for abdominal pain, diarrhea, nausea and vomiting.  Genitourinary:  Positive for dysuria and frequency. Negative for difficulty urinating, flank pain and pelvic pain.  Skin:  Negative for color change.  All other systems reviewed and are negative.    Vital Signs: Today's Vitals   01/29/22 1434 01/29/22 1437  BP: 124/70   Pulse: 76   Resp: 16   Temp: 98.4 F (36.9 C)   TempSrc: Oral   SpO2: 99%   Weight:  245 lb (111.1 kg)  Height:  5\' 6"  (1.676 m)  PainSc:  9     Physical Exam: Physical Exam Vitals and nursing note reviewed.  Constitutional:      Appearance: Normal appearance.  Cardiovascular:     Rate and Rhythm: Normal rate and regular rhythm.     Pulses: Normal pulses.     Heart sounds: Normal heart sounds.  Pulmonary:     Effort: Pulmonary effort is normal.     Breath sounds: Normal breath sounds.  Abdominal:     General: Abdomen is protuberant.     Palpations: Abdomen is soft.     Tenderness: There is no abdominal tenderness. There is no right CVA tenderness or left CVA tenderness.  Skin:    General: Skin is warm and dry.  Neurological:     General: No focal deficit present.     Mental Status: She is alert and oriented to person, place, and time.  Psychiatric:        Mood and Affect: Mood normal.        Behavior: Behavior normal.      Urgent Care Treatments / Results:   LABS: PLEASE NOTE: all labs that were ordered this encounter are listed, however only abnormal results are displayed. Labs Reviewed  WET PREP, GENITAL - Abnormal; Notable for the following components:      Result Value   Yeast Wet Prep HPF POC PRESENT (*)    WBC, Wet Prep HPF POC <10 (*)    All other components within normal limits  URINALYSIS, ROUTINE W REFLEX MICROSCOPIC - Abnormal;  Notable for the following components:   Hgb urine dipstick TRACE (*)    All other components within normal limits  URINALYSIS, MICROSCOPIC (REFLEX) - Abnormal; Notable for the following components:   Bacteria, UA RARE (*)    All other components within normal limits    EKG: -None  RADIOLOGY: No results found.  PROCEDURES: Procedures  MEDICATIONS RECEIVED THIS VISIT: Medications - No data to display  PERTINENT CLINICAL COURSE NOTES/UPDATES:   Initial Impression / Assessment and Plan / Urgent Care Course:  Pertinent labs & imaging results that were available during my care of the patient were personally reviewed by me and considered in my medical decision making (see lab/imaging section of note for values and interpretations).  Erica Pierce is a 46 y.o. female who presents to [facility] today with complaints of dysuria, diagnosed with vulvovaginal Candida, and treated as such with the medications below. NP and patient reviewed discharge instructions below during visit.   Patient is well appearing overall in clinic today. She does not appear to be in any acute distress. Presenting symptoms (see HPI) and exam as documented above.   I have reviewed the follow up and strict return precautions for any new or worsening symptoms. Patient is aware of symptoms that would be deemed urgent/emergent, and would thus require further evaluation either here or in the emergency department. At the time of discharge, she verbalized understanding and consent with the discharge plan as it was reviewed with her. All questions were fielded by provider and/or clinic staff prior to patient discharge.    Final Clinical Impressions / Urgent Care Diagnoses:   Final diagnoses:  Vulvovaginal candidiasis    New Prescriptions:   Controlled Substance Registry consulted? Not Applicable  Meds ordered this encounter  Medications   fluconazole (DIFLUCAN) 150 MG tablet    Sig: Take 1 tablet (150 mg  total) by mouth every 3 (three) days as needed.    Dispense:  2 tablet    Refill:  0      Discharge Instructions      You were seen for dysuria and are being treated for this infection.   -Take your medications as directed. -Follow-up with your primary care provider for diabetes management. -Increase water intake.  Take care, Dr. Marland Kitchen, NP-c     Recommended Follow up Care:  Patient encouraged to follow up with the following provider within the specified time frame, or sooner as dictated by the severity of her symptoms. As always, she was instructed that for any urgent/emergent care needs, she should seek care either here or in the emergency department for more immediate evaluation.  Bailey Mech, DNP, NP-c   Bailey Mech, NP 01/29/22 [phone]

## 2022-01-29 NOTE — Discharge Instructions (Addendum)
You were seen for dysuria and are being treated for this infection.   -Take your medications as directed. -Follow-up with your primary care provider for diabetes management. -Increase water intake.  Take care, Dr. Sharlet Salina, NP-c

## 2022-01-29 NOTE — ED Triage Notes (Signed)
Patient having urinary frequency, lower abdominal pain, and odor to the urine for a week now. No painful urination

## 2022-04-21 ENCOUNTER — Encounter: Payer: Self-pay | Admitting: Emergency Medicine

## 2022-04-21 ENCOUNTER — Ambulatory Visit
Admission: EM | Admit: 2022-04-21 | Discharge: 2022-04-21 | Disposition: A | Discharge status: Home / Self Care | Payer: Medicaid Other | Attending: Physician Assistant | Admitting: Physician Assistant

## 2022-04-21 ENCOUNTER — Ambulatory Visit (INDEPENDENT_AMBULATORY_CARE_PROVIDER_SITE_OTHER): Payer: Medicaid Other

## 2022-04-21 DIAGNOSIS — M546 Pain in thoracic spine: Secondary | ICD-10-CM | POA: Diagnosis not present

## 2022-04-21 DIAGNOSIS — R0789 Other chest pain: Secondary | ICD-10-CM | POA: Diagnosis not present

## 2022-04-21 DIAGNOSIS — R079 Chest pain, unspecified: Secondary | ICD-10-CM | POA: Diagnosis not present

## 2022-04-21 MED ORDER — CYCLOBENZAPRINE HCL 10 MG PO TABS: 10.0000 mg | ORAL_TABLET | Freq: Three times a day (TID) | ORAL | 0 refills | Status: DC | PRN

## 2022-04-21 MED ORDER — KETOROLAC TROMETHAMINE 60 MG/2ML IM SOLN
60.0000 mg | Freq: Once | INTRAMUSCULAR | Status: AC
Start: 2022-04-21 — End: 2022-04-21
  Administered 2022-04-21: 60 mg via INTRAMUSCULAR

## 2022-04-21 MED ORDER — NAPROXEN 500 MG PO TABS: 500.0000 mg | ORAL_TABLET | Freq: Two times a day (BID) | ORAL | 0 refills | Status: DC | PRN

## 2022-04-21 NOTE — ED Provider Notes (Signed)
[facility]    CSN: [id number] Arrival date & time: 04/21/22  1337      History   Chief Complaint Chief Complaint  Patient presents with   Chest Pain    HPI Erica Pierce is a 46 y.o. female presenting for right-sided chest pain with radiation to the right ribs for the past 4 hours.  Patient says the pain was initially dull.  She says it comes and goes and when it comes it is very sharp.  Touching the area increases pain and sometimes moving her arm increases the pain.  She also reports pain of the right upper back in the same region.  Denies any injury.  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  Patient's partner reports that she has to do a lot of lifting at work and has a history of chronic back and neck pain.  Patient takes occasional muscle relaxers.  She has not taken any muscle relaxers today or any anti-inflammatory medications or anything else for pain.  She follows up with [facility] for chronic back and neck pain.  Patient reports some increased pain when she takes of breath but denies feeling short of breath.  She does not feel any pain of the left side of her chest and denies palpitations, dizziness or weakness.  No numbness or tingling.  No recent worsening of her neck pain or back pain.  Medical history also significant for asthma, diabetes, hypertension and PCOS.  HPI  Past Medical History:  Diagnosis Date   Asthma    Chlamydia 1999   Gestational diabetes    Hypertension    PCOS (polycystic ovarian syndrome)     Patient Active Problem List   Diagnosis Date Noted   Decreased fetal movement affecting management of pregnancy in third trimester 03/24/2020    Past Surgical History:  Procedure Laterality Date   CESAREAN SECTION     CHOLECYSTECTOMY      OB History     Gravida  1   Para      Term      Preterm      AB      Living         SAB      IAB      Ectopic      Multiple      Live Births               Home  Medications    Prior to Admission medications   Medication Sig Start Date End Date Taking? Authorizing Provider  cyclobenzaprine (FLEXERIL) 10 MG tablet Take 1 tablet (10 mg total) by mouth 3 (three) times daily as needed for muscle spasms. 04/21/22  Yes Danton Clap, PA-C  metFORMIN (GLUCOPHAGE) 500 MG tablet Take by mouth 2 (two) times daily with a meal.   Yes [provider]  naproxen (NAPROSYN) 500 MG tablet Take 1 tablet (500 mg total) by mouth 2 (two) times daily as needed for moderate pain. 04/21/22  Yes Laurene Footman B, PA-C  NIFEdipine (PROCARDIA-XL/NIFEDICAL-XL) 30 MG 24 hr tablet Take 30 mg by mouth daily. 11/24/21  Yes [provider]  albuterol (VENTOLIN HFA) 108 (90 Base) MCG/ACT inhaler Inhale 2 puffs into the lungs every 4 (four) hours as needed for wheezing or shortness of breath.    [provider]  fluconazole (DIFLUCAN) 150 MG tablet Take 1 tablet (150 mg total) by mouth every 3 (three) days as needed. 01/29/22   Gertie Baron,  NP    Family History Family History  Problem Relation Age of Onset   Heart failure Mother    Cancer Paternal Grandmother    Cancer Paternal Grandfather     Social History Social History   Tobacco Use   Smoking status: Every Day    Types: Cigarettes   Smokeless tobacco: Never  Vaping Use   Vaping Use: Never used  Substance Use Topics   Alcohol use: No     Allergies   Elemental sulfur and Sulfa antibiotics   Review of Systems Review of Systems  Constitutional:  Negative for fatigue and fever.  HENT:  Negative for congestion.   Respiratory:  Negative for cough and shortness of breath.   Cardiovascular:  Positive for chest pain. Negative for palpitations.  Gastrointestinal:  Negative for abdominal pain, diarrhea, nausea and vomiting.  Genitourinary:  Negative for flank pain and hematuria.  Musculoskeletal:  Positive for back pain and neck pain (chronic). Negative for gait problem.  Neurological:   Negative for dizziness, syncope, weakness, numbness and headaches.     Physical Exam Triage Vital Signs ED Triage Vitals [04/21/22 1347]  Enc Vitals Group     BP 131/76     Pulse Rate 62     Resp 16     Temp 97.8 F (36.6 C)     Temp Source Oral     SpO2 100 %     Weight      Height      Head Circumference      Peak Flow      Pain Score      Pain Loc      Pain Edu?      Excl. in GC?    No data found.  Updated Vital Signs BP 131/76 (BP Location: Left Arm)   Pulse 62   Temp 97.8 F (36.6 C) (Oral)   Resp 16   LMP  (LMP Unknown)   SpO2 100%      Physical Exam Vitals and nursing note reviewed.  Constitutional:      General: She is not in acute distress.    Appearance: Normal appearance. She is not ill-appearing or toxic-appearing.  HENT:     Head: Normocephalic and atraumatic.     Nose: Nose normal.     Mouth/Throat:     Mouth: Mucous membranes are moist.     Pharynx: Oropharynx is clear.  Eyes:     General: No scleral icterus.       Right eye: No discharge.        Left eye: No discharge.     Conjunctiva/sclera: Conjunctivae normal.  Cardiovascular:     Rate and Rhythm: Normal rate and regular rhythm.     Heart sounds: Normal heart sounds.  Pulmonary:     Effort: Pulmonary effort is normal. No respiratory distress.     Breath sounds: Normal breath sounds.     Comments: There is diffuse tenderness palpation of the right chest and lateral ribs as well as the right trapezius. Chest:     Chest wall: Tenderness present.  Abdominal:     Palpations: Abdomen is soft.     Tenderness: There is no abdominal tenderness.  Musculoskeletal:     Cervical back: Neck supple.  Skin:    General: Skin is dry.  Neurological:     General: No focal deficit present.     Mental Status: She is alert. Mental status is at baseline.     Motor: No weakness.  Gait: Gait normal.  Psychiatric:        Mood and Affect: Mood normal.        Behavior: Behavior normal.         Thought Content: Thought content normal.      UC Treatments / Results  Labs (all labs ordered are listed, but only abnormal results are displayed) Labs Reviewed - No data to display  EKG   Radiology DG Chest 2 View  Result Date: 04/21/2022 CLINICAL DATA:  Right chest pain EXAM: CHEST - 2 VIEW COMPARISON:  08/15/2017 FINDINGS: The heart size and mediastinal contours are within normal limits. Both lungs are clear. The visualized skeletal structures are unremarkable. IMPRESSION: No active cardiopulmonary disease. Electronically Signed   By: Ernie Avena M.D.   On: 04/21/2022 14:38    Procedures ED EKG  Date/Time: 04/21/2022 2:17 PM  Performed by: Shirlee Latch, PA-C Authorized by: Shirlee Latch, PA-C   Previous ECG:    Previous ECG:  Compared to current   Similarity:  No change   Comparison ECG info:  Compared to 2021 Interpretation:    Interpretation: normal   Rate:    ECG rate:  68   ECG rate assessment: normal   Rhythm:    Rhythm: sinus rhythm   Ectopy:    Ectopy: none   QRS:    QRS axis:  Normal   QRS intervals:  Normal   QRS conduction: normal   ST segments:    ST segments:  Normal T waves:    T waves: normal   Comments:     Normal sinus rhythm. Regular rate.    (including critical care time)  Medications Ordered in UC Medications  ketorolac (TORADOL) injection 60 mg (60 mg Intramuscular Given 04/21/22 1413)    Initial Impression / Assessment and Plan / UC Course  I have reviewed the triage vital signs and the nursing notes.  Pertinent labs & imaging results that were available during my care of the patient were reviewed by me and considered in my medical decision making (see chart for details).   46 year old female presents with right-sided chest pain that is sharp in nature and comes and goes over the past 4 hours.  Pain has gotten worse.  No breathing difficulty but some increased pain with movement, breathing and palpating the chest.  No  injuries.  Vitals are all normal and stable and patient is overall well-appearing.  She is in no distress.  She has tenderness palpation diffusely about the anterior chest and actually pulls away when I palpate her chest.  She also has tenderness palpation of the right lateral ribs and right trapezius muscle.  Her chest is clear auscultation heart regular rate and rhythm.  An EKG performed today shows normal sinus rhythm and regular rate.  Chest x-ray ordered.  Patient given 60 mg IM ketorolac in clinic for acute pain relief which is suspected to be due to musculoskeletal cause.  Patient reports some relief with Toradol injection.  Sent naproxen to pharmacy as well as Flexeril.  We discussed use of topical lidocaine, muscle rubs, heat, ice, stretches and close monitoring.  ED precautions thoroughly reviewed but symptoms seem to be due to musculoskeletal cause of.   Final Clinical Impressions(s) / UC Diagnoses   Final diagnoses:  Chest wall pain  Acute right-sided thoracic back pain     Discharge Instructions      -EKG and chest x-ray look good.  I think your symptoms are due to  muscle strain and muscle spasms.  I sent a muscle relaxer to the pharmacy.  Also try heat, ice, Tylenol.  I sent naproxen which is an anti-inflammatory medication to pharmacy.  We also discussed trying topical Bengay with lidocaine gel. - Go to ER if your pain acutely worsens or you feel short of breath, dizzy or weak.     ED Prescriptions     Medication Sig Dispense Auth. Provider   cyclobenzaprine (FLEXERIL) 10 MG tablet Take 1 tablet (10 mg total) by mouth 3 (three) times daily as needed for muscle spasms. 20 tablet Eusebio Friendly B, PA-C   naproxen (NAPROSYN) 500 MG tablet Take 1 tablet (500 mg total) by mouth 2 (two) times daily as needed for moderate pain. 30 tablet Shirlee Latch, PA-C      I have reviewed the PDMP during this encounter.   Shirlee Latch, PA-C 04/21/22 1502

## 2022-04-21 NOTE — Discharge Instructions (Signed)
-  EKG and chest x-ray look good.  I think your symptoms are due to muscle strain and muscle spasms.  I sent a muscle relaxer to the pharmacy.  Also try heat, ice, Tylenol.  I sent naproxen which is an anti-inflammatory medication to pharmacy.  We also discussed trying topical Bengay with lidocaine gel. - Go to ER if your pain acutely worsens or you feel short of breath, dizzy or weak.

## 2022-05-11 ENCOUNTER — Ambulatory Visit (INDEPENDENT_AMBULATORY_CARE_PROVIDER_SITE_OTHER): Payer: Medicaid Other

## 2022-05-11 ENCOUNTER — Ambulatory Visit
Admission: EM | Admit: 2022-05-11 | Discharge: 2022-05-11 | Disposition: A | Discharge status: Home / Self Care | Payer: Medicaid Other | Attending: Internal Medicine | Admitting: Internal Medicine

## 2022-05-11 DIAGNOSIS — M1712 Unilateral primary osteoarthritis, left knee: Secondary | ICD-10-CM

## 2022-05-11 DIAGNOSIS — K121 Other forms of stomatitis: Secondary | ICD-10-CM

## 2022-05-11 DIAGNOSIS — M25562 Pain in left knee: Secondary | ICD-10-CM

## 2022-05-11 HISTORY — DX: Sciatica, left side: M54.32

## 2022-05-11 HISTORY — DX: Other chronic pain: G89.29

## 2022-05-11 MED ORDER — LIDOCAINE VISCOUS HCL 2 % MT SOLN: OROMUCOSAL | 0 refills | Status: DC

## 2022-05-11 MED ORDER — MELOXICAM 15 MG PO TABS: 15.0000 mg | ORAL_TABLET | Freq: Every day | ORAL | 0 refills | Status: DC

## 2022-05-11 NOTE — Discharge Instructions (Signed)
Follow up with orthopedics, they may be able to give you an injection to help Work on loosing weight, for every pound we are heavy is like 4 pounds of weight.  Avoid sweets, white flour, any foods with corn syrup, and any vegetable oils which causes more inflammation in your body. Extra virgin oil is the best to use.

## 2022-05-11 NOTE — ED Triage Notes (Signed)
Patient reports that she can not eat anything on the left side of her mouth -- started about 3-4 days ago.   Patient also reports that she is having back pain and left knee pain. Patient reports that she has an appt with her PCP next month.

## 2022-05-11 NOTE — ED Provider Notes (Signed)
[facility]    CSN: [id number] Arrival date & time: 05/11/22  1440      History   Chief Complaint Chief Complaint  Patient presents with   Dental Pain   Knee Pain    Left    Back Pain    HPI Erica Pierce is a 46 y.o. female who presents with 3 complaints  1- L side gum pain x 3-4 days, and cant see anything.   2 - L knee pain x 3 months, and gradually been worse. Feels her chronic back pain is worse due to her knee pain. She follows with chronic back pain Dr. And her next apt is in December. She works on her feet.  Has been doing PT for her back x 1 week and has 5 more weeks to finish.     Past Medical History:  Diagnosis Date   Asthma    Chlamydia 1999   Chronic back pain    Gestational diabetes    Hypertension    PCOS (polycystic ovarian syndrome)    Sciatica, left side     Patient Active Problem List   Diagnosis Date Noted   Decreased fetal movement affecting management of pregnancy in third trimester 03/24/2020    Past Surgical History:  Procedure Laterality Date   CESAREAN SECTION     CHOLECYSTECTOMY      OB History     Gravida  1   Para      Term      Preterm      AB      Living         SAB      IAB      Ectopic      Multiple      Live Births               Home Medications    Prior to Admission medications   Medication Sig Start Date End Date Taking? Authorizing Provider  lidocaine (XYLOCAINE) 2 % solution Apply with q tip to gum sore q 6h prn pain 05/11/22  Yes Rodriguez-Southworth, Sunday Spillers, PA-C  meloxicam (MOBIC) 15 MG tablet Take 1 tablet (15 mg total) by mouth daily. 05/11/22  Yes Rodriguez-Southworth, Sunday Spillers, PA-C  albuterol (VENTOLIN HFA) 108 (90 Base) MCG/ACT inhaler Inhale 2 puffs into the lungs every 4 (four) hours as needed for wheezing or shortness of breath.    [provider]  cyclobenzaprine (FLEXERIL) 10 MG tablet Take 1 tablet (10 mg total) by mouth 3 (three) times daily as needed  for muscle spasms. 04/21/22   Danton Clap, PA-C  metFORMIN (GLUCOPHAGE) 500 MG tablet Take by mouth 2 (two) times daily with a meal.    [provider]  naproxen (NAPROSYN) 500 MG tablet Take 1 tablet (500 mg total) by mouth 2 (two) times daily as needed for moderate pain. 04/21/22   Laurene Footman B, PA-C  NIFEdipine (PROCARDIA-XL/NIFEDICAL-XL) 30 MG 24 hr tablet Take 30 mg by mouth daily. 11/24/21   [provider]    Family History Family History  Problem Relation Age of Onset   Heart failure Mother    Cancer Paternal Grandmother    Cancer Paternal Grandfather     Social History Social History   Tobacco Use   Smoking status: Every Day    Types: Cigarettes   Smokeless tobacco: Never  Vaping Use   Vaping Use: Never used  Substance Use Topics   Alcohol use: No     Allergies  Elemental sulfur and Sulfa antibiotics   Review of Systems Review of Systems  Constitutional:  Negative for fever.  HENT:  Positive for mouth sores.   Genitourinary:  Negative for difficulty urinating.  Musculoskeletal:  Positive for arthralgias and back pain.     Physical Exam Triage Vital Signs ED Triage Vitals  Enc Vitals Group     BP 05/11/22 1449 137/79     Pulse Rate 05/11/22 1449 79     Resp --      Temp 05/11/22 1449 98.4 F (36.9 C)     Temp Source 05/11/22 1449 Oral     SpO2 05/11/22 1449 97 %     Weight 05/11/22 1448 260 lb 3.2 oz (118 kg)     Height 05/11/22 1448 5\' 6"  (1.676 m)     Head Circumference --      Peak Flow --      Pain Score 05/11/22 1448 8     Pain Loc --      Pain Edu? --      Excl. in GC? --    No data found.  Updated Vital Signs BP 137/79 (BP Location: Left Arm)   Pulse 79   Temp 98.4 F (36.9 C) (Oral)   Ht 5\' 6"  (1.676 m)   Wt 260 lb 3.2 oz (118 kg)   LMP  (LMP Unknown)   SpO2 97%   BMI 42.00 kg/m   Visual Acuity Right Eye Distance:   Left Eye Distance:   Bilateral Distance:    Right Eye Near:   Left Eye Near:     Bilateral Near:     Physical Exam Vitals and nursing note reviewed.  Constitutional:      General: She is not in acute distress.    Appearance: She is obese.  HENT:     Right Ear: External ear normal.     Left Ear: External ear normal.     Mouth/Throat:     Comments: Has a shallow ulcer on L lower gum area Eyes:     General: No scleral icterus.    Conjunctiva/sclera: Conjunctivae normal.  Pulmonary:     Effort: Pulmonary effort is normal.  Musculoskeletal:     Cervical back: Neck supple.     Comments: L KNEE- with no swelling, redness or warmth. Has local tenderness on the posterior patella tendon and on patella. Mild on joint line. ROM is normal, but with a lot of pain with full extension and flexion > 90 degrees. No laxity noted.  Skin:    General: Skin is warm and dry.     Findings: No rash.  Neurological:     Mental Status: She is alert and oriented to person, place, and time.     Gait: Gait normal.     Deep Tendon Reflexes: Reflexes normal.  Psychiatric:        Mood and Affect: Mood normal.        Behavior: Behavior normal.        Thought Content: Thought content normal.        Judgment: Judgment normal.      UC Treatments / Results  Labs (all labs ordered are listed, but only abnormal results are displayed) Labs Reviewed - No data to display  EKG   Radiology No results found.  Procedures Procedures (including critical care time)  Medications Ordered in UC Medications - No data to display  Initial Impression / Assessment and Plan / UC Course  I have reviewed the  triage vital signs and the nursing notes.  Pertinent  imaging results that were available during my care of the patient were reviewed by me and considered in my medical decision making (see chart for details).  L knee OA Stomatitis  I placed her on Lidocaine jelly and Mobic as noted.  See instructions  Final Clinical Impressions(s) / UC Diagnoses  L knee OA Stomatitis Final diagnoses:   Stomatitis  Left knee pain, unspecified chronicity  Primary osteoarthritis of left knee     Discharge Instructions      Follow up with orthopedics, they may be able to give you an injection to help Work on loosing weight, for every pound we are heavy is like 4 pounds of weight.  Avoid sweets, white flour, any foods with corn syrup, and any vegetable oils which causes more inflammation in your body. Extra virgin oil is the best to use.      ED Prescriptions     Medication Sig Dispense Auth. Provider   lidocaine (XYLOCAINE) 2 % solution Apply with q tip to gum sore q 6h prn pain 20 mL Rodriguez-Southworth, Nettie Elm, PA-C   meloxicam (MOBIC) 15 MG tablet Take 1 tablet (15 mg total) by mouth daily. 30 tablet Rodriguez-Southworth, Nettie Elm, PA-C      I have reviewed the PDMP during this encounter.   Garey Ham, [place] 05/23/22 1208

## 2022-05-20 ENCOUNTER — Ambulatory Visit
Admission: EM | Admit: 2022-05-20 | Discharge: 2022-05-20 | Disposition: A | Discharge status: Home / Self Care | Payer: Medicaid Other | Attending: Emergency Medicine | Admitting: Emergency Medicine

## 2022-05-20 ENCOUNTER — Ambulatory Visit (INDEPENDENT_AMBULATORY_CARE_PROVIDER_SITE_OTHER): Payer: Medicaid Other

## 2022-05-20 DIAGNOSIS — Z79899 Other long term (current) drug therapy: Secondary | ICD-10-CM | POA: Diagnosis not present

## 2022-05-20 DIAGNOSIS — J029 Acute pharyngitis, unspecified: Secondary | ICD-10-CM

## 2022-05-20 DIAGNOSIS — R519 Headache, unspecified: Secondary | ICD-10-CM | POA: Diagnosis not present

## 2022-05-20 DIAGNOSIS — Z1152 Encounter for screening for COVID-19: Secondary | ICD-10-CM | POA: Diagnosis not present

## 2022-05-20 LAB — RESP PANEL BY RT-PCR (FLU A&B, COVID) ARPGX2
Influenza A by PCR: NEGATIVE
Influenza B by PCR: NEGATIVE
SARS Coronavirus 2 by RT PCR: NEGATIVE

## 2022-05-20 LAB — GROUP A STREP BY PCR: Group A Strep by PCR: NOT DETECTED

## 2022-05-20 MED ORDER — IBUPROFEN 600 MG PO TABS
600.0000 mg | ORAL_TABLET | Freq: Once | ORAL | Status: AC
Start: 2022-05-20 — End: 2022-05-20
  Administered 2022-05-20: 600 mg via ORAL

## 2022-05-20 MED ORDER — ACETAMINOPHEN 500 MG PO TABS
1000.0000 mg | ORAL_TABLET | Freq: Once | ORAL | Status: AC
  Administered 2022-05-20: 1000 mg via ORAL

## 2022-05-20 NOTE — Discharge Instructions (Addendum)
Your x-ray showed that you have pharyngitis, but there is concern for retropharyngeal abscess, which is a life-threatening infection.  Please go immediately to the [facility] emergency department.  Let them know if you feel like your throat is swelling shut, if you are having difficulty breathing or swallowing your saliva because your throat is swelling shut, or for other concerns.  Your COVID, influenza and strep PCR negative.

## 2022-05-20 NOTE — ED Notes (Signed)
Patient is being discharged from the Urgent Care and sent to the Emergency Department via POV . Per Alphonzo Cruise, MD, patient is in need of higher level of care due to further work-up. Patient is aware and verbalizes understanding of plan of care.  Vitals:   05/20/22 0821  BP: 134/84  Pulse: 99  Temp: 100.3 F (37.9 C)  SpO2: 97%

## 2022-05-20 NOTE — ED Triage Notes (Signed)
Pt c/o sore throat and headache onset this morning, hurts to talk

## 2022-05-20 NOTE — ED Provider Notes (Signed)
HPI  SUBJECTIVE:  Erica Pierce is a 46 y.o. female who presents with acute onset of fevers Tmax 100.3, headache, body ache, severe sore throat, postnasal drip, muffled voice.  No nasal congestion, rhinorrhea, loss of sense of smell or taste, cough, wheeze, shortness of breath, nausea, vomiting, diarrhea, abdominal pain.  No rash.  No sensation of throat swelling shut, trismus.  No neck stiffness, although she reports pain with neck flexion/extension.  Her son is sick with similar symptoms, but he does not have COVID or influenza, RSV.  No known strep, mono, COVID, flu exposure.  She did not get COVID or flu vaccines.  No antibiotics in the past month.  No antipyretic in the past 6 hours.  She tried TheraFlu and Hall's without improvement in her symptoms.  Symptoms worse with talking, swallowing and with being cold.  She has a past medical history of asthma.  LMP: 3 days ago.  Denies possibility being pregnant.  PCP: [facility].   Past Medical History:  Diagnosis Date   Asthma    Chlamydia 1999   Chronic back pain    Gestational diabetes    Hypertension    PCOS (polycystic ovarian syndrome)    Sciatica, left side     Past Surgical History:  Procedure Laterality Date   CESAREAN SECTION     CHOLECYSTECTOMY      Family History  Problem Relation Age of Onset   Heart failure Mother    Cancer Paternal Grandmother    Cancer Paternal Grandfather     Social History   Tobacco Use   Smoking status: Every Day    Types: Cigarettes   Smokeless tobacco: Never  Vaping Use   Vaping Use: Never used  Substance Use Topics   Alcohol use: No    No current facility-administered medications for this encounter.  Current Outpatient Medications:    albuterol (VENTOLIN HFA) 108 (90 Base) MCG/ACT inhaler, Inhale 2 puffs into the lungs every 4 (four) hours as needed for wheezing or shortness of breath., Disp: , Rfl:    cyclobenzaprine (FLEXERIL) 10 MG tablet, Take 1 tablet (10 mg total)  by mouth 3 (three) times daily as needed for muscle spasms., Disp: 20 tablet, Rfl: 0   lidocaine (XYLOCAINE) 2 % solution, Apply with q tip to gum sore q 6h prn pain, Disp: 20 mL, Rfl: 0   meloxicam (MOBIC) 15 MG tablet, Take 1 tablet (15 mg total) by mouth daily., Disp: 30 tablet, Rfl: 0   metFORMIN (GLUCOPHAGE) 500 MG tablet, Take by mouth 2 (two) times daily with a meal., Disp: , Rfl:    naproxen (NAPROSYN) 500 MG tablet, Take 1 tablet (500 mg total) by mouth 2 (two) times daily as needed for moderate pain., Disp: 30 tablet, Rfl: 0   NIFEdipine (PROCARDIA-XL/NIFEDICAL-XL) 30 MG 24 hr tablet, Take 30 mg by mouth daily., Disp: , Rfl:   Allergies  Allergen Reactions   Elemental Sulfur    Sulfa Antibiotics Hives and Swelling     ROS  As noted in HPI.   Physical Exam  BP 134/84 (BP Location: Left Arm)   Pulse 99   Temp 100.3 F (37.9 C) (Oral)   Ht 5\' 6"  (1.676 m)   Wt 118 kg   LMP 05/17/2022 (Approximate)   SpO2 97%   BMI 42.00 kg/m   Constitutional: Well developed, well nourished, no acute distress.  Muffled voice.  Difficult to understand. Eyes:  EOMI, conjunctiva normal bilaterally HENT: Normocephalic, atraumatic,mucus membranes moist.  Minimal nasal congestion.  Erythematous, slightly swollen tonsils with scant exudates.  Uvula midline.  No drooling, trismus Neck: Positive cervical lymphadenopathy.  No neck stiffness. Respiratory: Normal inspiratory effort Cardiovascular: Normal rate, regular rhythm, no murmurs GI: nondistended soft, nontender, no splenomegaly skin: No rash, skin intact Musculoskeletal: no deformities Neurologic: Alert & oriented x 3, no focal neuro deficits Psychiatric: Speech and behavior appropriate   ED Course   Medications  acetaminophen (TYLENOL) tablet 1,000 mg (1,000 mg Oral Given 05/20/22 0839)  ibuprofen (ADVIL) tablet 600 mg (600 mg Oral Given 05/20/22 0840)    Orders Placed This Encounter  Procedures   Group A Strep by PCR     Standing Status:   Standing    Number of Occurrences:   1   Resp Panel by RT-PCR (Flu A&B, Covid) Anterior Nasal Swab    Standing Status:   Standing    Number of Occurrences:   1   DG Neck Soft Tissue    Standing Status:   Standing    Number of Occurrences:   1    Order Specific Question:   Reason for Exam (SYMPTOM  OR DIAGNOSIS REQUIRED)    Answer:   Muffled voice, severe sore throat, rule out epiglottitis, RPA    Results for orders placed or performed during the hospital encounter of 05/20/22 (from the past 24 hour(s))  Group A Strep by PCR     Status: None   Collection Time: 05/20/22  8:23 AM   Specimen: Throat; Sterile Swab  Result Value Ref Range   Group A Strep by PCR NOT DETECTED NOT DETECTED  Resp Panel by RT-PCR (Flu A&B, Covid) Anterior Nasal Swab     Status: None   Collection Time: 05/20/22  8:41 AM   Specimen: Anterior Nasal Swab  Result Value Ref Range   SARS Coronavirus 2 by RT PCR NEGATIVE NEGATIVE   Influenza A by PCR NEGATIVE NEGATIVE   Influenza B by PCR NEGATIVE NEGATIVE   DG Neck Soft Tissue  Result Date: 05/20/2022 CLINICAL DATA:  Sore throat EXAM: NECK SOFT TISSUES - 2 VIEW COMPARISON:  None Available. FINDINGS: Thickening of the aryepiglottic folds. Mild prevertebral soft tissue thickening. No epiglottic enlargement. Cervical airway is unremarkable and no radio-opaque foreign body identified. IMPRESSION: 1. Thickening of the aryepiglottic folds, compatible with pharyngitis. No evidence of epiglottic enlargement. 2. Mild prevertebral soft tissue thickening, possibly positional, although retroperitoneal abscess can not be excluded. Recommend repeat lateral radiograph with neck in extension or contrast-enhanced neck CT for further evaluation. Electronically Signed   By: Yetta Glassman M.D.   On: 05/20/2022 09:04    ED Clinical Impression  1. Pharyngitis, unspecified etiology      ED Assessment/Plan     Patient with enlarged tonsils with scant exudates,  however they are not as erythematous or as large as I would expect to cause this degree of a muffled voice.  Checking soft tissue neck to rule epiglottitis, RPA.  No evidence of peritonsillar abscess.  COVID, flu, strep.  Giving Tylenol/ibuprofen.  Withholding steroids at this point in time as steriods are not clearly indicated for RPA or epiglottitis.  Reviewed imaging independently, and discussed with radiology.  Previous vertebral soft tissue thickening, cannot rule out RPA.  Thickening of the aryepiglottic folds compatible with pharyngitis.  Epiglottis appears normal.  Radiology report for details.  Strep COVID, influenza PCR negative.  I am concerned that the patient has an RPA.  Transferring to the emergency department for further evaluation and  advanced imaging.  However, I feel that she is stable to go via private vehicle, she is tolerating secretions, she has a low-grade fever, but no  trismus.  Patient has opted to [facility] to [facility] emergency department.  Discussed medical decision-making, imaging, rationale for transfer to the emergency department with the patient.  She agrees to go.   Meds ordered this encounter  Medications   acetaminophen (TYLENOL) tablet 1,000 mg   ibuprofen (ADVIL) tablet 600 mg      *This clinic note was created using Scientist, clinical (histocompatibility and immunogenetics). Therefore, there may be occasional mistakes despite careful proofreading.  ?    Domenick Gong, MD 05/20/22 [phone]

## 2022-05-24 ENCOUNTER — Ambulatory Visit
Admission: EM | Admit: 2022-05-24 | Discharge: 2022-05-24 | Disposition: A | Discharge status: Home / Self Care | Payer: Medicaid Other | Attending: Family Medicine | Admitting: Family Medicine

## 2022-05-24 ENCOUNTER — Encounter: Payer: Self-pay | Admitting: Emergency Medicine

## 2022-05-24 DIAGNOSIS — J039 Acute tonsillitis, unspecified: Secondary | ICD-10-CM

## 2022-05-24 MED ORDER — CEFDINIR 300 MG PO CAPS: 300.0000 mg | ORAL_CAPSULE | Freq: Two times a day (BID) | ORAL | 0 refills | Status: DC

## 2022-05-24 MED ORDER — OLOPATADINE HCL 0.1 % OP SOLN: 1.0000 [drp] | Freq: Two times a day (BID) | OPHTHALMIC | 12 refills | Status: DC

## 2022-05-24 NOTE — ED Triage Notes (Signed)
Pt c/o sore throat, cough. Started about 4 days ago. She was seen on 05/20/22 for this and swabbed for strep, covid and flu. All negative. She was sent to the ED for her symptoms. She states she is not better if not she is worse.

## 2022-05-24 NOTE — ED Provider Notes (Signed)
[facility]    CSN: [id number] Arrival date & time: 05/24/22  1520      History   Chief Complaint Chief Complaint  Patient presents with   Sore Throat    HPI Erica Pierce is a 46 y.o. female.   HPI   Erica Pierce presents for ongoing sore throat since Thursday. She was seen Friday both here and in the ED.  She has headache, painful swallowing fluids and solids, ear pain, nasal congestion. She has been gargling with salty warm water, Tylenol and Ibuprofen.  No Tylenol or Ibuprofen today.   No chills, vomiting or new diarrhea.  Reports having some eye discharge after sleeping on the ceiling fan.  She feels like the eye is red on the right side.    Past Medical History:  Diagnosis Date   Asthma    Chlamydia 1999   Chronic back pain    Gestational diabetes    Hypertension    PCOS (polycystic ovarian syndrome)    Sciatica, left side     Patient Active Problem List   Diagnosis Date Noted   Decreased fetal movement affecting management of pregnancy in third trimester 03/24/2020    Past Surgical History:  Procedure Laterality Date   CESAREAN SECTION     CHOLECYSTECTOMY      OB History     Gravida  1   Para      Term      Preterm      AB      Living         SAB      IAB      Ectopic      Multiple      Live Births               Home Medications    Prior to Admission medications   Medication Sig Start Date End Date Taking? Authorizing Provider  cefdinir (OMNICEF) 300 MG capsule Take 1 capsule (300 mg total) by mouth 2 (two) times daily for 7 days. 05/24/22 05/31/22 Yes Shyniece Scripter, DO  olopatadine (PATADAY) 0.1 % ophthalmic solution Place 1 drop into both eyes 2 (two) times daily. 05/24/22  Yes Charlene Cowdrey, DO  albuterol (VENTOLIN HFA) 108 (90 Base) MCG/ACT inhaler Inhale 2 puffs into the lungs every 4 (four) hours as needed for wheezing or shortness of breath.    [provider]  cyclobenzaprine (FLEXERIL) 10 MG  tablet Take 1 tablet (10 mg total) by mouth 3 (three) times daily as needed for muscle spasms. 04/21/22   Laurene Footman B, PA-C  lidocaine (XYLOCAINE) 2 % solution Apply with q tip to gum sore q 6h prn pain 05/11/22   Rodriguez-Southworth, Sunday Spillers, PA-C  meloxicam (MOBIC) 15 MG tablet Take 1 tablet (15 mg total) by mouth daily. 05/11/22   Rodriguez-Southworth, Sunday Spillers, PA-C  metFORMIN (GLUCOPHAGE) 500 MG tablet Take by mouth 2 (two) times daily with a meal.    [provider]  naproxen (NAPROSYN) 500 MG tablet Take 1 tablet (500 mg total) by mouth 2 (two) times daily as needed for moderate pain. 04/21/22   Laurene Footman B, PA-C  NIFEdipine (PROCARDIA-XL/NIFEDICAL-XL) 30 MG 24 hr tablet Take 30 mg by mouth daily. 11/24/21   [provider]    Family History Family History  Problem Relation Age of Onset   Heart failure Mother    Cancer Paternal Grandmother    Cancer Paternal Grandfather     Social History Social History   Tobacco  Use   Smoking status: Every Day    Types: Cigarettes   Smokeless tobacco: Never  Vaping Use   Vaping Use: Never used  Substance Use Topics   Alcohol use: No   Drug use: Never     Allergies   Elemental sulfur and Sulfa antibiotics   Review of Systems Review of Systems: negative unless otherwise stated in HPI.      Physical Exam Triage Vital Signs ED Triage Vitals  Enc Vitals Group     BP 05/24/22 1600 133/71     Pulse Rate 05/24/22 1600 85     Resp 05/24/22 1600 16     Temp 05/24/22 1600 98.1 F (36.7 C)     Temp Source 05/24/22 1600 Oral     SpO2 05/24/22 1600 100 %     Weight 05/24/22 1559 260 lb 2.3 oz (118 kg)     Height 05/24/22 1559 5\' 6"  (1.676 m)     Head Circumference --      Peak Flow --      Pain Score 05/24/22 1558 8     Pain Loc --      Pain Edu? --      Excl. in [place]? --    No data found.  Updated Vital Signs BP 133/71 (BP Location: Right Arm)   Pulse 85   Temp 98.1 F (36.7 C) (Oral)   Resp 16   Ht 5'  6" (1.676 m)   Wt 118 kg   LMP 05/17/2022 (Approximate)   SpO2 100%   BMI 41.99 kg/m   Visual Acuity Right Eye Distance:   Left Eye Distance:   Bilateral Distance:    Right Eye Near:   Left Eye Near:    Bilateral Near:     Physical Exam GEN:     alert, non-toxic appearing female in no distress     HENT:  mucus membranes moist, oropharyngeal with exudates and 2+ tonsillar hypertrophy,  mild oropharyngeal erythema, nares patent EYES:   pupils equal and reactive, EOMi, medial right scleral injection NECK:  normal ROM, no meningismus   RESP:  no increased work of breathing,  clear to auscultation bilaterally CVS:   regular rate  and rhythm Skin:   warm and dry, no rash on visible skin , normal  skin turgor    UC Treatments / Results  Labs (all labs ordered are listed, but only abnormal results are displayed) Labs Reviewed - No data to display  EKG   Radiology No results found.  Procedures Procedures (including critical care time)  Medications Ordered in UC Medications - No data to display  Initial Impression / Assessment and Plan / UC Course  I have reviewed the triage vital signs and the nursing notes.  Pertinent labs & imaging results that were available during my care of the patient were reviewed by me and considered in my medical decision making (see chart for details).       Pt is a 46 y.o. female who presents for 4 days of sore throat with cough. Erica Pierce is afebrile here without recent antipyretics. Satting well on room air. Overall pt is nontoxic appearing, well hydrated, without respiratory distress. Pulmonary exam is unremarkable.  COVID and influenza was negative on 05/20/2022.  Strep PCR was also negative.    Patient seen in the urgent care and referred to the emergency department for concern of retropharyngeal  abscess.  Reviewed available labs, imaging and notes from [facility] and [facility] ED evaluation from 05/20/2022.  She did have a mild leukocytosis of, WBC 11.8.   Note states that she did not have a retropharyngeal abscess on CT.  She was discharged with a viral URI and told to take Tylenol and Motrin.  Given that she had a leukocytosis and symptoms are persisting we will treat with antibiotics for her persistent tonsillitis.  Pataday drops Rx sent to pharmacy.  ED and return precautions given.  Patient voiced understanding. Discussed MDM, treatment plan and plan for follow-up with patient who agrees with plan.     Final Clinical Impressions(s) / UC Diagnoses   Final diagnoses:  Acute tonsillitis, unspecified etiology     Discharge Instructions      Stop by the pharmacy to pick up your prescriptions. Consider establishing care with a primary care provider.      ED Prescriptions     Medication Sig Dispense Auth. Provider   cefdinir (OMNICEF) 300 MG capsule Take 1 capsule (300 mg total) by mouth 2 (two) times daily for 7 days. 14 capsule Teighan Aubert, DO   olopatadine (PATADAY) 0.1 % ophthalmic solution Place 1 drop into both eyes 2 (two) times daily. 5 mL Katha Cabal, DO      PDMP not reviewed this encounter.   Katha Cabal, DO 05/24/22 2022

## 2022-05-24 NOTE — Discharge Instructions (Addendum)
Stop by the pharmacy to pick up your prescriptions. Consider establishing care with a primary care provider.

## 2022-05-25 ENCOUNTER — Ambulatory Visit
Admission: EM | Admit: 2022-05-25 | Discharge: 2022-05-25 | Disposition: A | Discharge status: Home / Self Care | Payer: Medicaid Other | Attending: Internal Medicine | Admitting: Internal Medicine

## 2022-05-25 DIAGNOSIS — J039 Acute tonsillitis, unspecified: Secondary | ICD-10-CM

## 2022-05-25 DIAGNOSIS — H109 Unspecified conjunctivitis: Secondary | ICD-10-CM | POA: Diagnosis not present

## 2022-05-25 MED ORDER — MAGIC MOUTHWASH: 5.0000 mL | Freq: Four times a day (QID) | ORAL | 0 refills | Status: DC | PRN

## 2022-05-25 MED ORDER — POLYMYXIN B-TRIMETHOPRIM 10000-0.1 UNIT/ML-% OP SOLN: 2.0000 [drp] | Freq: Three times a day (TID) | OPHTHALMIC | 0 refills | Status: DC

## 2022-05-25 NOTE — ED Provider Notes (Signed)
[facility]    CSN: [id number] Arrival date & time: 05/25/22  1055      History   Chief Complaint Chief Complaint  Patient presents with   Eye Problem    HPI Erica Pierce is a 46 y.o. female who presents today due to having R eye yellow drainage since yesterday. She started with tearing of R eye initially, and today has been draining cloudy matter. She never purchased the pataday yesterday since it was not covered. His sone now has the same thing. She was told at work she could not come back til drainage was gone  2- she also would like to have something to numb her throat, her tonsil pain is not as severe since starting the antibiotic yesterday.     Past Medical History:  Diagnosis Date   Asthma    Chlamydia 1999   Chronic back pain    Gestational diabetes    Hypertension    PCOS (polycystic ovarian syndrome)    Sciatica, left side     Patient Active Problem List   Diagnosis Date Noted   Decreased fetal movement affecting management of pregnancy in third trimester 03/24/2020    Past Surgical History:  Procedure Laterality Date   CESAREAN SECTION     CHOLECYSTECTOMY      OB History     Gravida  1   Para      Term      Preterm      AB      Living         SAB      IAB      Ectopic      Multiple      Live Births               Home Medications    Prior to Admission medications   Medication Sig Start Date End Date Taking? Authorizing Provider  trimethoprim-polymyxin b (POLYTRIM) ophthalmic solution Place 2 drops into the right eye 3 (three) times daily. For 7 days 05/25/22  Yes Rodriguez-Southworth, Nettie Elm, PA-C  albuterol (VENTOLIN HFA) 108 (90 Base) MCG/ACT inhaler Inhale 2 puffs into the lungs every 4 (four) hours as needed for wheezing or shortness of breath.    [provider]  cefdinir (OMNICEF) 300 MG capsule Take 1 capsule (300 mg total) by mouth 2 (two) times daily for 7 days. 05/24/22 05/31/22  Katha Cabal, DO  cyclobenzaprine (FLEXERIL) 10 MG tablet Take 1 tablet (10 mg total) by mouth 3 (three) times daily as needed for muscle spasms. 04/21/22   Eusebio Friendly B, PA-C  lidocaine (XYLOCAINE) 2 % solution Apply with q tip to gum sore q 6h prn pain 05/11/22   Rodriguez-Southworth, Nettie Elm, PA-C  magic mouthwash SOLN Take 5 mLs by mouth 4 (four) times daily as needed for mouth pain. 05/25/22   Rodriguez-Southworth, Nettie Elm, PA-C  meloxicam (MOBIC) 15 MG tablet Take 1 tablet (15 mg total) by mouth daily. 05/11/22   Rodriguez-Southworth, Nettie Elm, PA-C  metFORMIN (GLUCOPHAGE) 500 MG tablet Take by mouth 2 (two) times daily with a meal.    [provider]  naproxen (NAPROSYN) 500 MG tablet Take 1 tablet (500 mg total) by mouth 2 (two) times daily as needed for moderate pain. 04/21/22   Eusebio Friendly B, PA-C  NIFEdipine (PROCARDIA-XL/NIFEDICAL-XL) 30 MG 24 hr tablet Take 30 mg by mouth daily. 11/24/21   [provider]  olopatadine (PATADAY) 0.1 % ophthalmic solution Place 1 drop into both eyes 2 (  two) times daily. 05/24/22   Lyndee Hensen, DO    Family History Family History  Problem Relation Age of Onset   Heart failure Mother    Cancer Paternal Grandmother    Cancer Paternal Grandfather     Social History Social History   Tobacco Use   Smoking status: Every Day    Types: Cigarettes   Smokeless tobacco: Never  Vaping Use   Vaping Use: Never used  Substance Use Topics   Alcohol use: No   Drug use: Never     Allergies   Elemental sulfur and Sulfa antibiotics   Review of Systems Review of Systems  Constitutional:  Negative for fever.  HENT:  Positive for sore throat.   Eyes:  Positive for discharge, redness and itching. Negative for pain.  Hematological:  Positive for adenopathy.     Physical Exam Triage Vital Signs ED Triage Vitals  Enc Vitals Group     BP 05/25/22 1223 112/75     Pulse Rate 05/25/22 1223 74     Resp --      Temp 05/25/22 1223 98 F  (36.7 C)     Temp Source 05/25/22 1223 Oral     SpO2 05/25/22 1223 96 %     Weight 05/25/22 1222 260 lb 2.3 oz (118 kg)     Height 05/25/22 1222 5\' 6"  (1.676 m)     Head Circumference --      Peak Flow --      Pain Score 05/25/22 1222 0     Pain Loc --      Pain Edu? --      Excl. in [place]? --    No data found.  Updated Vital Signs BP 112/75 (BP Location: Left Arm)   Pulse 74   Temp 98 F (36.7 C) (Oral)   Ht 5\' 6"  (1.676 m)   Wt 260 lb 2.3 oz (118 kg)   LMP 05/17/2022 (Approximate)   SpO2 96%   BMI 41.99 kg/m   Visual Acuity Right Eye Distance:   Left Eye Distance:   Bilateral Distance:    Right Eye Near:   Left Eye Near:    Bilateral Near:     Physical Exam Vitals and nursing note reviewed.  Constitutional:      General: She is not in acute distress.    Appearance: She is obese. She is not toxic-appearing.  HENT:     Mouth/Throat:     Tonsils: Tonsillar exudate present. 2+ on the right. 2+ on the left.  Eyes:     General: Lids are normal.        Right eye: Discharge present.     Conjunctiva/sclera:     Right eye: Right conjunctiva is injected.  Musculoskeletal:     Cervical back: Neck supple.  Lymphadenopathy:     Cervical: Cervical adenopathy present.  Neurological:     Mental Status: She is alert.      UC Treatments / Results  Labs (all labs ordered are listed, but only abnormal results are displayed) Labs Reviewed - No data to display  EKG   Radiology No results found.  Procedures Procedures (including critical care time)  Medications Ordered in UC Medications - No data to display  Initial Impression / Assessment and Plan / UC Course  I have reviewed the triage vital signs and the nursing notes.  Tonsillitis under treatment Exudative L tonsil Bacterial R conjunctivitis  I placed her on Magic mouth wash to gargle with and  Polytrim as noted.   Final Clinical Impressions(s) / UC Diagnoses  Tonsillitis under treatment Exudative L  tonsil Bacterial R conjunctivitis Final diagnoses:  Bacterial conjunctivitis of right eye  Acute tonsillitis, unspecified etiology   Discharge Instructions   None    ED Prescriptions     Medication Sig Dispense Auth. Provider   trimethoprim-polymyxin b (POLYTRIM) ophthalmic solution Place 2 drops into the right eye 3 (three) times daily. For 7 days 10 mL Rodriguez-Southworth, Sunday Spillers, PA-C   magic mouthwash SOLN  (Status: Discontinued) Take 5 mLs by mouth 4 (four) times daily as needed for mouth pain. 200 mL Rodriguez-Southworth, Sunday Spillers, PA-C   magic mouthwash SOLN Take 5 mLs by mouth 4 (four) times daily as needed for mouth pain. 200 mL Rodriguez-Southworth, Sunday Spillers, PA-C      PDMP not reviewed this encounter.   Shelby Mattocks, [place] 05/25/22 1832

## 2022-05-25 NOTE — ED Triage Notes (Signed)
Patient reports that she was here yesterday. Provider told her she would return to work but work will not let her.   Patient reports that she was in here yesterday and was diagnosis with tonsillitis and was placed on an antibiotic but nothing for her right eye.

## 2022-05-31 ENCOUNTER — Ambulatory Visit
Admission: EM | Admit: 2022-05-31 | Discharge: 2022-05-31 | Disposition: A | Discharge status: Home / Self Care | Payer: Medicaid Other | Attending: Urgent Care | Admitting: Urgent Care

## 2022-05-31 DIAGNOSIS — M25462 Effusion, left knee: Secondary | ICD-10-CM

## 2022-05-31 DIAGNOSIS — J020 Streptococcal pharyngitis: Secondary | ICD-10-CM

## 2022-05-31 DIAGNOSIS — M25562 Pain in left knee: Secondary | ICD-10-CM | POA: Diagnosis present

## 2022-05-31 LAB — GROUP A STREP BY PCR: Group A Strep by PCR: NOT DETECTED

## 2022-05-31 NOTE — Discharge Instructions (Signed)
Recommend you request referral to orthopedics for evaluation of the pain and swelling in your left knee.  For a hinged brace today which might provide you some relief from your symptoms.

## 2022-05-31 NOTE — ED Triage Notes (Signed)
Pt needs a work note stating that she can return to work and work around food.   Pt had a white pocket in the back of her throat and was evaluated at [facility] urgent care and [facility] and was told she had tonsillitis.   Pt was given cefdinir by [facility] and Amoxicillin from [facility].   Pt states that she had ongoing pain in her left foot and knee. Pt asks for a knee brace due to her arthritis.

## 2022-05-31 NOTE — ED Provider Notes (Signed)
[facility]    CSN: [id number] Arrival date & time: 05/31/22  1413      History   Chief Complaint Chief Complaint  Patient presents with   Sore Throat    HPI Erica Pierce is a 46 y.o. female.    Sore Throat    Presents to urgent care stating work note stating she is safe to return to work.  She endorses ongoing pain in her left foot and knee.  She requests knee brace due to her arthritis.  Multiple recent visits for pharyngitis, URI at urgent care and ED.   05/11/2022-seen at [facility] urgent care at [facility] for stomatitis after complaint of left-sided gum pain.  She was discharged with lidocaine solution.  05/20/2022-[facility] urgent care at [facility].  Patient was transferred to ED for concern of retropharyngeal abscess.  05/20/2022-[facility] ED.  CT imaging showing pharyngitis without abscess or malignant lesion.  Treated for presumed viral pharyngitis with symptomatic management.  05/24/2022-seen in [facility] urgent care again for acute tonsillitis and treated with antibiotics given leukocytosis and persistent symptoms.  Cefdinir was ordered.  05/25/2022-seen again at [facility] urgent care, now for right eye yellow drainage.  Treated with Polytrim.  05/26/2022-seen again at [facility] ED for continuous throat pain.  Prescription was switched to Augmentin  Past Medical History:  Diagnosis Date   Asthma    Chlamydia 1999   Chronic back pain    Gestational diabetes    Hypertension    PCOS (polycystic ovarian syndrome)    Sciatica, left side     Patient Active Problem List   Diagnosis Date Noted   Decreased fetal movement affecting management of pregnancy in third trimester 03/24/2020    Past Surgical History:  Procedure Laterality Date   CESAREAN SECTION     CHOLECYSTECTOMY      OB History     Gravida  1   Para      Term      Preterm      AB      Living         SAB      IAB      Ectopic      Multiple      Live Births               Home Medications     Prior to Admission medications   Medication Sig Start Date End Date Taking? Authorizing Provider  albuterol (VENTOLIN HFA) 108 (90 Base) MCG/ACT inhaler Inhale 2 puffs into the lungs every 4 (four) hours as needed for wheezing or shortness of breath.    [provider]  cefdinir (OMNICEF) 300 MG capsule Take 1 capsule (300 mg total) by mouth 2 (two) times daily for 7 days. 05/24/22 05/31/22  Katha Cabal, DO  cyclobenzaprine (FLEXERIL) 10 MG tablet Take 1 tablet (10 mg total) by mouth 3 (three) times daily as needed for muscle spasms. 04/21/22   Eusebio Friendly B, PA-C  lidocaine (XYLOCAINE) 2 % solution Apply with q tip to gum sore q 6h prn pain 05/11/22   Rodriguez-Southworth, Nettie Elm, PA-C  magic mouthwash SOLN Take 5 mLs by mouth 4 (four) times daily as needed for mouth pain. 05/25/22   Rodriguez-Southworth, Nettie Elm, PA-C  meloxicam (MOBIC) 15 MG tablet Take 1 tablet (15 mg total) by mouth daily. 05/11/22   Rodriguez-Southworth, Nettie Elm, PA-C  metFORMIN (GLUCOPHAGE) 500 MG tablet Take by mouth 2 (two) times daily with a meal.    [provider]  naproxen (NAPROSYN)  500 MG tablet Take 1 tablet (500 mg total) by mouth 2 (two) times daily as needed for moderate pain. 04/21/22   Eusebio Friendly B, PA-C  NIFEdipine (PROCARDIA-XL/NIFEDICAL-XL) 30 MG 24 hr tablet Take 30 mg by mouth daily. 11/24/21   [provider]  olopatadine (PATADAY) 0.1 % ophthalmic solution Place 1 drop into both eyes 2 (two) times daily. 05/24/22   Katha Cabal, DO  trimethoprim-polymyxin b (POLYTRIM) ophthalmic solution Place 2 drops into the right eye 3 (three) times daily. For 7 days 05/25/22   Rodriguez-Southworth, Nettie Elm, PA-C    Family History Family History  Problem Relation Age of Onset   Heart failure Mother    Cancer Paternal Grandmother    Cancer Paternal Grandfather     Social History Social History   Tobacco Use   Smoking status: Every Day    Types: Cigarettes   Smokeless tobacco:  Never  Vaping Use   Vaping Use: Never used  Substance Use Topics   Alcohol use: No   Drug use: Never     Allergies   Elemental sulfur and Sulfa antibiotics   Review of Systems Review of Systems   Physical Exam Triage Vital Signs ED Triage Vitals [05/31/22 1505]  Enc Vitals Group     BP      Pulse      Resp      Temp      Temp src      SpO2      Weight      Height      Head Circumference      Peak Flow      Pain Score 0     Pain Loc      Pain Edu?      Excl. in GC?    No data found.  Updated Vital Signs LMP 05/17/2022 (Approximate)   Visual Acuity Right Eye Distance:   Left Eye Distance:   Bilateral Distance:    Right Eye Near:   Left Eye Near:    Bilateral Near:     Physical Exam Vitals reviewed.  Constitutional:      Appearance: She is well-developed. She is not ill-appearing.  HENT:     Mouth/Throat:     Pharynx: Oropharynx is clear. No oropharyngeal exudate or posterior oropharyngeal erythema.     Tonsils: No tonsillar exudate.  Musculoskeletal:     Left knee: Swelling and effusion present.  Skin:    General: Skin is warm and dry.  Neurological:     General: No focal deficit present.     Mental Status: She is alert and oriented to person, place, and time.  Psychiatric:        Mood and Affect: Mood normal.        Behavior: Behavior normal.      UC Treatments / Results  Labs (all labs ordered are listed, but only abnormal results are displayed) Labs Reviewed  GROUP A STREP BY PCR    EKG   Radiology No results found.  Procedures Procedures (including critical care time)  Medications Ordered in UC Medications - No data to display  Initial Impression / Assessment and Plan / UC Course  I have reviewed the triage vital signs and the nursing notes.  Pertinent labs & imaging results that were available during my care of the patient were reviewed by me and considered in my medical decision making (see chart for details).    Patient is afebrile here without recent antipyretics. Satting well on  room air. Overall is well appearing, well hydrated, without respiratory distress. Pulmonary exam is unremarkable.  No pharyngeal erythema or peritonsillar exudates are seen.  Generalized swelling around the left knee with some possible effusion.  Recommended patient be evaluated in orthopedics.  Final Clinical Impressions(s) / UC Diagnoses   Final diagnoses:  None   Discharge Instructions   None    ED Prescriptions   None    PDMP not reviewed this encounter.   Charma Igo, [place] 05/31/22 1533

## 2022-06-27 ENCOUNTER — Ambulatory Visit
Admission: EM | Admit: 2022-06-27 | Discharge: 2022-06-27 | Disposition: A | Discharge status: Home / Self Care | Payer: Medicaid Other | Attending: Internal Medicine | Admitting: Internal Medicine

## 2022-06-27 ENCOUNTER — Encounter: Payer: Self-pay | Admitting: Emergency Medicine

## 2022-06-27 DIAGNOSIS — J4521 Mild intermittent asthma with (acute) exacerbation: Secondary | ICD-10-CM

## 2022-06-27 DIAGNOSIS — J111 Influenza due to unidentified influenza virus with other respiratory manifestations: Secondary | ICD-10-CM

## 2022-06-27 MED ORDER — OSELTAMIVIR PHOSPHATE 75 MG PO CAPS: 75.0000 mg | ORAL_CAPSULE | Freq: Two times a day (BID) | ORAL | 0 refills | Status: DC

## 2022-06-27 MED ORDER — BENZONATATE 100 MG PO CAPS: 100.0000 mg | ORAL_CAPSULE | Freq: Three times a day (TID) | ORAL | 0 refills | Status: DC | PRN

## 2022-06-27 MED ORDER — PREDNISONE 20 MG PO TABS: 20.0000 mg | ORAL_TABLET | Freq: Every day | ORAL | 0 refills | Status: AC

## 2022-06-27 NOTE — ED Triage Notes (Signed)
Pt presents with a cough and SOB that started yesterday. Pt states her sister was dx with flu yesterday.

## 2022-06-27 NOTE — Discharge Instructions (Addendum)
Please maintain adequate hydration Take medications as prescribed No need for flu testing since you were exposed to a confirmed case of flu and you have developed similar symptoms Return to urgent care if symptoms worsen.

## 2022-06-28 NOTE — ED Provider Notes (Signed)
[facility]    CSN: [id number] Arrival date & time: 06/27/22  1946      History   Chief Complaint Chief Complaint  Patient presents with   Cough    HPI Erica Pierce is a 46 y.o. female comes to the urgent care with 1 day history of nonproductive cough.  Patient has some shortness of breath and wheezing.  She has a history of asthma and has not been using her albuterol inhaler in spite of her difficulty breathing.  No fever or chills.  No nausea or vomiting.  No chest pain or chest pressure.  Patient was exposed to positive influenza A individual.  Patient's family member was diagnosed with influenza A yesterday.  No diarrhea.   HPI  Past Medical History:  Diagnosis Date   Asthma    Chlamydia 1999   Chronic back pain    Gestational diabetes    Hypertension    PCOS (polycystic ovarian syndrome)    Sciatica, left side     Patient Active Problem List   Diagnosis Date Noted   Decreased fetal movement affecting management of pregnancy in third trimester 03/24/2020    Past Surgical History:  Procedure Laterality Date   CESAREAN SECTION     CHOLECYSTECTOMY      OB History     Gravida  1   Para      Term      Preterm      AB      Living         SAB      IAB      Ectopic      Multiple      Live Births               Home Medications    Prior to Admission medications   Medication Sig Start Date End Date Taking? Authorizing Provider  benzonatate (TESSALON) 100 MG capsule Take 1 capsule (100 mg total) by mouth 3 (three) times daily as needed for cough. 06/27/22  Yes Imari Reen, Britta Mccreedy, MD  oseltamivir (TAMIFLU) 75 MG capsule Take 1 capsule (75 mg total) by mouth every 12 (twelve) hours. 06/27/22  Yes Srinidhi Landers, Britta Mccreedy, MD  predniSONE (DELTASONE) 20 MG tablet Take 1 tablet (20 mg total) by mouth daily for 5 days. 06/27/22 07/02/22 Yes Magdelena Kinsella, Britta Mccreedy, MD  albuterol (VENTOLIN HFA) 108 (90 Base) MCG/ACT inhaler Inhale 2 puffs into the  lungs every 4 (four) hours as needed for wheezing or shortness of breath.    [provider]  cyclobenzaprine (FLEXERIL) 10 MG tablet Take 1 tablet (10 mg total) by mouth 3 (three) times daily as needed for muscle spasms. 04/21/22   Eusebio Friendly B, PA-C  lidocaine (XYLOCAINE) 2 % solution Apply with q tip to gum sore q 6h prn pain 05/11/22   Rodriguez-Southworth, Nettie Elm, PA-C  magic mouthwash SOLN Take 5 mLs by mouth 4 (four) times daily as needed for mouth pain. 05/25/22   Rodriguez-Southworth, Nettie Elm, PA-C  meloxicam (MOBIC) 15 MG tablet Take 1 tablet (15 mg total) by mouth daily. 05/11/22   Rodriguez-Southworth, Nettie Elm, PA-C  metFORMIN (GLUCOPHAGE) 500 MG tablet Take by mouth 2 (two) times daily with a meal.    [provider]  naproxen (NAPROSYN) 500 MG tablet Take 1 tablet (500 mg total) by mouth 2 (two) times daily as needed for moderate pain. 04/21/22   Eusebio Friendly B, PA-C  NIFEdipine (PROCARDIA-XL/NIFEDICAL-XL) 30 MG 24 hr tablet Take 30 mg by  mouth daily. 11/24/21   [provider]  olopatadine (PATADAY) 0.1 % ophthalmic solution Place 1 drop into both eyes 2 (two) times daily. 05/24/22   Katha Cabal, DO  trimethoprim-polymyxin b (POLYTRIM) ophthalmic solution Place 2 drops into the right eye 3 (three) times daily. For 7 days 05/25/22   Rodriguez-Southworth, Nettie Elm, PA-C    Family History Family History  Problem Relation Age of Onset   Heart failure Mother    Cancer Paternal Grandmother    Cancer Paternal Grandfather     Social History Social History   Tobacco Use   Smoking status: Former    Types: Cigarettes   Smokeless tobacco: Never  Vaping Use   Vaping Use: Never used  Substance Use Topics   Alcohol use: No   Drug use: Never     Allergies   Elemental sulfur, Sulfa antibiotics, and Shellfish allergy   Review of Systems Review of Systems  Constitutional: Negative.   HENT:  Positive for congestion.   Respiratory:  Positive for cough,  chest tightness, shortness of breath and wheezing.   Gastrointestinal: Negative.   Genitourinary: Negative.   Musculoskeletal: Negative.   Skin: Negative.   Neurological: Negative.      Physical Exam Triage Vital Signs ED Triage Vitals [06/27/22 2027]  Enc Vitals Group     BP 138/78     Pulse Rate 75     Resp 18     Temp 98.3 F (36.8 C)     Temp Source Oral     SpO2 100 %     Weight      Height      Head Circumference      Peak Flow      Pain Score      Pain Loc      Pain Edu?      Excl. in GC?    No data found.  Updated Vital Signs BP 138/78 (BP Location: Left Arm)   Pulse 75   Temp 98.3 F (36.8 C) (Oral)   Resp 18   LMP 06/22/2022   SpO2 100%   Visual Acuity Right Eye Distance:   Left Eye Distance:   Bilateral Distance:    Right Eye Near:   Left Eye Near:    Bilateral Near:     Physical Exam Vitals and nursing note reviewed.  Constitutional:      General: She is not in acute distress.    Appearance: She is not ill-appearing.  HENT:     Right Ear: Tympanic membrane normal.     Left Ear: Tympanic membrane normal.     Mouth/Throat:     Mouth: Mucous membranes are moist.     Pharynx: No posterior oropharyngeal erythema.  Cardiovascular:     Rate and Rhythm: Normal rate and regular rhythm.     Pulses: Normal pulses.     Heart sounds: Normal heart sounds.  Pulmonary:     Effort: Pulmonary effort is normal.     Breath sounds: Wheezing present. No rhonchi.  Abdominal:     General: Bowel sounds are normal.     Palpations: Abdomen is soft.  Neurological:     Mental Status: She is alert.      UC Treatments / Results  Labs (all labs ordered are listed, but only abnormal results are displayed) Labs Reviewed - No data to display  EKG   Radiology No results found.  Procedures Procedures (including critical care time)  Medications Ordered in UC Medications - No  data to display  Initial Impression / Assessment and Plan / UC Course  I  have reviewed the triage vital signs and the nursing notes.  Pertinent labs & imaging results that were available during my care of the patient were reviewed by me and considered in my medical decision making (see chart for details).     1.  Mild intermittent asthma with acute exacerbation: 2.  Influenza A infection: Tamiflu 75 mg twice daily for 5 days Tessalon Perles as needed for cough Prednisone 20 mg orally daily Maintain adequate hydration Patient is encouraged to use albuterol inhaler as needed for chest tightness, shortness of breath or wheezing No need for flu testing Return precautions given Lung exam is clear except for expiratory wheezing. Final Clinical Impressions(s) / UC Diagnoses   Final diagnoses:  Influenza  AB (asthmatic bronchitis), mild intermittent, with acute exacerbation     Discharge Instructions      Please maintain adequate hydration Take medications as prescribed No need for flu testing since you were exposed to a confirmed case of flu and you have developed similar symptoms Return to urgent care if symptoms worsen.   ED Prescriptions     Medication Sig Dispense Auth. Provider   oseltamivir (TAMIFLU) 75 MG capsule Take 1 capsule (75 mg total) by mouth every 12 (twelve) hours. 10 capsule Dessiree Sze, Britta Mccreedy, MD   benzonatate (TESSALON) 100 MG capsule Take 1 capsule (100 mg total) by mouth 3 (three) times daily as needed for cough. 21 capsule Makylah Bossard, Britta Mccreedy, MD   predniSONE (DELTASONE) 20 MG tablet Take 1 tablet (20 mg total) by mouth daily for 5 days. 5 tablet Yancey Pedley, Britta Mccreedy, MD      PDMP not reviewed this encounter.   Merrilee Jansky, MD 06/28/22 [phone]

## 2022-06-29 ENCOUNTER — Encounter: Payer: Self-pay | Admitting: Emergency Medicine

## 2022-06-29 ENCOUNTER — Ambulatory Visit
Admission: EM | Admit: 2022-06-29 | Discharge: 2022-06-29 | Disposition: A | Discharge status: Home / Self Care | Payer: Medicaid Other | Attending: Physician Assistant | Admitting: Physician Assistant

## 2022-06-29 DIAGNOSIS — J111 Influenza due to unidentified influenza virus with other respiratory manifestations: Secondary | ICD-10-CM

## 2022-06-29 DIAGNOSIS — J45901 Unspecified asthma with (acute) exacerbation: Secondary | ICD-10-CM

## 2022-06-29 DIAGNOSIS — R051 Acute cough: Secondary | ICD-10-CM

## 2022-06-29 MED ORDER — IPRATROPIUM BROMIDE 0.06 % NA SOLN: 2.0000 | Freq: Four times a day (QID) | NASAL | 0 refills | Status: DC

## 2022-06-29 MED ORDER — ALBUTEROL SULFATE HFA 108 (90 BASE) MCG/ACT IN AERS: 2.0000 | INHALATION_SPRAY | RESPIRATORY_TRACT | 0 refills | Status: DC | PRN

## 2022-06-29 MED ORDER — PROMETHAZINE-DM 6.25-15 MG/5ML PO SYRP: 5.0000 mL | ORAL_SOLUTION | Freq: Four times a day (QID) | ORAL | 0 refills | Status: DC | PRN

## 2022-06-29 NOTE — ED Provider Notes (Signed)
[facility]    CSN: [id number] Arrival date & time: 06/29/22  1341      History   Chief Complaint Chief Complaint  Patient presents with   Cough   Shortness of Breath    HPI Erica Pierce is a 46 y.o. female with history of asthma.  Patient presents today for continued flu symptoms.  She was seen 2 days ago and diagnosed based on her symptoms and exposure.  She reports continued cough, fatigue, shortness of breath, sore throat.  Reports that she is coughing so much it makes her throat burning.  She says she tried over-the-counter cough medicines and they have not helped.  She is taking Tamiflu and says that that seems to have helped.  Was prescribed benzonatate which she says is not helpful.  She also does not have an inhaler and requests a refill of her inhaler.  She has not had any fevers.  She says she wants to go back to work tomorrow but needs something for cough.  No worsening of any of her symptoms.  HPI  Past Medical History:  Diagnosis Date   Asthma    Chlamydia 1999   Chronic back pain    Gestational diabetes    Hypertension    PCOS (polycystic ovarian syndrome)    Sciatica, left side     Patient Active Problem List   Diagnosis Date Noted   Decreased fetal movement affecting management of pregnancy in third trimester 03/24/2020    Past Surgical History:  Procedure Laterality Date   CESAREAN SECTION     CHOLECYSTECTOMY      OB History     Gravida  1   Para      Term      Preterm      AB      Living         SAB      IAB      Ectopic      Multiple      Live Births               Home Medications    Prior to Admission medications   Medication Sig Start Date End Date Taking? Authorizing Provider  benzonatate (TESSALON) 100 MG capsule Take 1 capsule (100 mg total) by mouth 3 (three) times daily as needed for cough. 06/27/22  Yes Lamptey, Britta Mccreedy, MD  cyclobenzaprine (FLEXERIL) 10 MG tablet Take 1 tablet (10 mg total)  by mouth 3 (three) times daily as needed for muscle spasms. 04/21/22  Yes Eusebio Friendly B, PA-C  ipratropium (ATROVENT) 0.06 % nasal spray Place 2 sprays into both nostrils 4 (four) times daily. 06/29/22  Yes Eusebio Friendly B, PA-C  lidocaine (XYLOCAINE) 2 % solution Apply with q tip to gum sore q 6h prn pain 05/11/22  Yes Rodriguez-Southworth, Nettie Elm, PA-C  magic mouthwash SOLN Take 5 mLs by mouth 4 (four) times daily as needed for mouth pain. 05/25/22  Yes Rodriguez-Southworth, Nettie Elm, PA-C  meloxicam (MOBIC) 15 MG tablet Take 1 tablet (15 mg total) by mouth daily. 05/11/22  Yes Rodriguez-Southworth, Nettie Elm, PA-C  metFORMIN (GLUCOPHAGE) 500 MG tablet Take by mouth 2 (two) times daily with a meal.   Yes [provider]  naproxen (NAPROSYN) 500 MG tablet Take 1 tablet (500 mg total) by mouth 2 (two) times daily as needed for moderate pain. 04/21/22  Yes Eusebio Friendly B, PA-C  NIFEdipine (PROCARDIA-XL/NIFEDICAL-XL) 30 MG 24 hr tablet Take 30 mg by mouth daily. 11/24/21  Yes [provider]  olopatadine (PATADAY) 0.1 % ophthalmic solution Place 1 drop into both eyes 2 (two) times daily. 05/24/22  Yes Brimage, Seward Meth, DO  oseltamivir (TAMIFLU) 75 MG capsule Take 1 capsule (75 mg total) by mouth every 12 (twelve) hours. 06/27/22  Yes Lamptey, Britta Mccreedy, MD  predniSONE (DELTASONE) 20 MG tablet Take 1 tablet (20 mg total) by mouth daily for 5 days. 06/27/22 07/02/22 Yes Lamptey, Britta Mccreedy, MD  promethazine-dextromethorphan (PROMETHAZINE-DM) 6.25-15 MG/5ML syrup Take 5 mLs by mouth 4 (four) times daily as needed. 06/29/22  Yes Shirlee Latch, PA-C  albuterol (VENTOLIN HFA) 108 (90 Base) MCG/ACT inhaler Inhale 2 puffs into the lungs every 4 (four) hours as needed for wheezing or shortness of breath. 06/29/22   Shirlee Latch, PA-C  trimethoprim-polymyxin b (POLYTRIM) ophthalmic solution Place 2 drops into the right eye 3 (three) times daily. For 7 days 05/25/22   Rodriguez-Southworth, Nettie Elm, PA-C     Family History Family History  Problem Relation Age of Onset   Heart failure Mother    Cancer Paternal Grandmother    Cancer Paternal Grandfather     Social History Social History   Tobacco Use   Smoking status: Former    Types: Cigarettes   Smokeless tobacco: Never  Vaping Use   Vaping Use: Never used  Substance Use Topics   Alcohol use: No   Drug use: Never     Allergies   Elemental sulfur, Sulfa antibiotics, and Shellfish allergy   Review of Systems Review of Systems  Constitutional:  Positive for fatigue. Negative for chills, diaphoresis and fever.  HENT:  Positive for congestion, rhinorrhea and sore throat. Negative for ear pain, sinus pressure and sinus pain.   Respiratory:  Positive for cough and shortness of breath.   Cardiovascular:  Negative for chest pain.  Gastrointestinal:  Negative for abdominal pain, nausea and vomiting.  Musculoskeletal:  Negative for arthralgias and myalgias.  Skin:  Negative for rash.  Neurological:  Negative for weakness and headaches.  Hematological:  Negative for adenopathy.     Physical Exam Triage Vital Signs ED Triage Vitals [06/29/22 1612]  Enc Vitals Group     BP 138/80     Pulse Rate 60     Resp 16     Temp 98.4 F (36.9 C)     Temp Source Oral     SpO2 98 %     Weight      Height      Head Circumference      Peak Flow      Pain Score 10     Pain Loc      Pain Edu?      Excl. in GC?    No data found.  Updated Vital Signs BP 138/80 (BP Location: Left Arm)   Pulse 60   Temp 98.4 F (36.9 C) (Oral)   Resp 16   LMP 06/22/2022   SpO2 98%    Physical Exam Vitals and nursing note reviewed.  Constitutional:      General: She is not in acute distress.    Appearance: Normal appearance. She is not ill-appearing or toxic-appearing.  HENT:     Head: Normocephalic and atraumatic.     Nose: Congestion present.     Mouth/Throat:     Mouth: Mucous membranes are moist.     Pharynx: Oropharynx is clear.  Posterior oropharyngeal erythema present.  Eyes:     General: No scleral icterus.  Right eye: No discharge.        Left eye: No discharge.     Conjunctiva/sclera: Conjunctivae normal.  Cardiovascular:     Rate and Rhythm: Normal rate and regular rhythm.     Heart sounds: Normal heart sounds.  Pulmonary:     Effort: Pulmonary effort is normal. No respiratory distress.     Breath sounds: Wheezing (few scattered wheezes) present.  Musculoskeletal:     Cervical back: Neck supple.  Skin:    General: Skin is dry.  Neurological:     General: No focal deficit present.     Mental Status: She is alert. Mental status is at baseline.     Motor: No weakness.     Gait: Gait normal.  Psychiatric:        Mood and Affect: Mood normal.        Behavior: Behavior normal.        Thought Content: Thought content normal.      UC Treatments / Results  Labs (all labs ordered are listed, but only abnormal results are displayed) Labs Reviewed - No data to display  EKG   Radiology No results found.  Procedures Procedures (including critical care time)  Medications Ordered in UC Medications - No data to display  Initial Impression / Assessment and Plan / UC Course  I have reviewed the triage vital signs and the nursing notes.  Pertinent labs & imaging results that were available during my care of the patient were reviewed by me and considered in my medical decision making (see chart for details).   46 y/o female presents for cough and congestion, SOB, fatigue, sore throat.  Diagnosed with influenza 2 days ago.  Is taking Tamiflu, benzonatate.  Request cough medicine and an inhaler.  History of asthma.  Vitals normal and stable and she is overall well-appearing.  She has nasal congestion, mild posterior pharyngeal erythema and few scattered wheezes.  No distress.  Chest x-ray performed today is within normal limits and I discussed this with her.  Influenza and asthma exacerbation.   Prescribed albuterol inhaler, Promethazine DM and Atrovent nasal spray.  Advised to continue Tamiflu, rest and fluids.  Advised to contact us if anything else is needed especially if she would like a different cough medication.  Would consider Cheratussin if Promethazine DM is not helping.   Final Clinical Impressions(s) / UC Diagnoses   Final diagnoses:  Influenza  Acute cough  Asthma with acute exacerbation, unspecified asthma severity, unspecified whether persistent     Discharge Instructions      -I have sent a cough medication, inhaler and nasal spray to the pharmacy.  Continue the Tamiflu and increase your rest and fluid intake.  You should be feeling better over the next 2 days. - If that cough medication is not helping you over the next 24 hours, call us back and we may consider sending something different. - You need to be seen again if you start to have a fever or have increased shortness of breath not relieved by use of the inhaler.     ED Prescriptions     Medication Sig Dispense Auth. Provider   albuterol (VENTOLIN HFA) 108 (90 Base) MCG/ACT inhaler Inhale 2 puffs into the lungs every 4 (four) hours as needed for wheezing or shortness of breath. 1 g Shirlee LatchEaves, Alyrica Thurow B, PA-C   promethazine-dextromethorphan (PROMETHAZINE-DM) 6.25-15 MG/5ML syrup Take 5 mLs by mouth 4 (four) times daily as needed. 118 mL Shirlee LatchEaves, Amour Trigg B, PA-C  ipratropium (ATROVENT) 0.06 % nasal spray Place 2 sprays into both nostrils 4 (four) times daily. 15 mL Shirlee Latch, PA-C      I have reviewed the PDMP during this encounter.   Shirlee Latch, PA-C 06/29/22 1706

## 2022-06-29 NOTE — ED Triage Notes (Signed)
Pt c/o cough, shortness of breath, sore throat. She was diagnosed with the 2 days ago. She states she needs something for the cough. She states what she was given 2 days ago is not working.

## 2022-06-29 NOTE — Discharge Instructions (Addendum)
-  I have sent a cough medication, inhaler and nasal spray to the pharmacy.  Continue the Tamiflu and increase your rest and fluid intake.  You should be feeling better over the next 2 days. - If that cough medication is not helping you over the next 24 hours, call us back and we may consider sending something different. - You need to be seen again if you start to have a fever or have increased shortness of breath not relieved by use of the inhaler.

## 2022-07-08 ENCOUNTER — Encounter: Payer: Self-pay | Admitting: Emergency Medicine

## 2022-07-08 ENCOUNTER — Ambulatory Visit
Admission: EM | Admit: 2022-07-08 | Discharge: 2022-07-08 | Disposition: A | Discharge status: Home / Self Care | Payer: Medicaid Other | Attending: Family Medicine | Admitting: Family Medicine

## 2022-07-08 ENCOUNTER — Ambulatory Visit (INDEPENDENT_AMBULATORY_CARE_PROVIDER_SITE_OTHER): Payer: Medicaid Other

## 2022-07-08 DIAGNOSIS — R002 Palpitations: Secondary | ICD-10-CM | POA: Diagnosis not present

## 2022-07-08 DIAGNOSIS — R0789 Other chest pain: Secondary | ICD-10-CM | POA: Diagnosis not present

## 2022-07-08 DIAGNOSIS — R0602 Shortness of breath: Secondary | ICD-10-CM

## 2022-07-08 LAB — CBC WITH DIFFERENTIAL/PLATELET
Abs Immature Granulocytes: 0.15 10*3/uL — ABNORMAL HIGH (ref 0.00–0.07)
Basophils Absolute: 0 10*3/uL (ref 0.0–0.1)
Basophils Relative: 0 %
Eosinophils Absolute: 0.1 10*3/uL (ref 0.0–0.5)
Eosinophils Relative: 1 %
HCT: 36.9 % (ref 36.0–46.0)
Hemoglobin: 11 g/dL — ABNORMAL LOW (ref 12.0–15.0)
Immature Granulocytes: 1 %
Lymphocytes Relative: 51 %
Lymphs Abs: 5.7 10*3/uL — ABNORMAL HIGH (ref 0.7–4.0)
MCH: 19.6 pg — ABNORMAL LOW (ref 26.0–34.0)
MCHC: 29.8 g/dL — ABNORMAL LOW (ref 30.0–36.0)
MCV: 65.8 fL — ABNORMAL LOW (ref 80.0–100.0)
Monocytes Absolute: 0.7 10*3/uL (ref 0.1–1.0)
Monocytes Relative: 6 %
Neutro Abs: 4.6 10*3/uL (ref 1.7–7.7)
Neutrophils Relative %: 41 %
Platelets: 484 10*3/uL — ABNORMAL HIGH (ref 150–400)
RBC: 5.61 MIL/uL — ABNORMAL HIGH (ref 3.87–5.11)
RDW: 17.9 % — ABNORMAL HIGH (ref 11.5–15.5)
WBC: 11.2 10*3/uL — ABNORMAL HIGH (ref 4.0–10.5)
nRBC: 0 % (ref 0.0–0.2)

## 2022-07-08 LAB — TROPONIN I (HIGH SENSITIVITY): Troponin I (High Sensitivity): <2 ng/L (ref <18)

## 2022-07-08 LAB — BASIC METABOLIC PANEL
Anion gap: 6 (ref 5–15)
BUN: 11 mg/dL (ref 6–20)
CO2: 28 mmol/L (ref 22–32)
Calcium: 8.8 mg/dL — ABNORMAL LOW (ref 8.9–10.3)
Chloride: 102 mmol/L (ref 98–111)
Creatinine, Ser: 0.74 mg/dL (ref 0.44–1.00)
GFR, Estimated: >60 mL/min (ref >60)
Glucose, Bld: 94 mg/dL (ref 70–99)
Potassium: 3.9 mmol/L (ref 3.5–5.1)
Sodium: 136 mmol/L (ref 135–145)

## 2022-07-08 LAB — TSH: TSH: 2.699 u[IU]/mL (ref 0.350–4.500)

## 2022-07-08 LAB — BRAIN NATRIURETIC PEPTIDE: B Natriuretic Peptide: 15.5 pg/mL (ref 0.0–100.0)

## 2022-07-08 MED ORDER — CHERATUSSIN AC 100-10 MG/5ML PO SOLN: 5.0000 mL | Freq: Three times a day (TID) | ORAL | 0 refills | Status: DC | PRN

## 2022-07-08 NOTE — Discharge Instructions (Addendum)
Your EKG did not show cause for concern.  Your chest xray was normal. I do not this this is heart or lung related.    Stop drink green tea and and avoid caffeinated beverages as these could be a cause of your palpitations.   Go to ED for red flag symptoms, including; fevers you cannot reduce with Tylenol/Motrin, severe headaches, vision changes, numbness/weakness in part of the body, lethargy, confusion, intractable vomiting, severe dehydration, chest pain, breathing difficulty, severe persistent abdominal or pelvic pain, signs of severe infection (increased redness, swelling of an area), feeling faint or passing out, dizziness, etc. You should especially go to the ED for sudden acute worsening of condition if you do not elect to go at this time.

## 2022-07-08 NOTE — ED Triage Notes (Signed)
Patient c/o SOB, chest pain and swelling in both feet for the past 4-5 days.  Patient has history of asthma.  Patient was treated for flu earlier this month.

## 2022-07-08 NOTE — ED Provider Notes (Signed)
[facility]    CSN: [id number] Arrival date & time: 07/08/22  1315      History   Chief Complaint Chief Complaint  Patient presents with   Chest Pain   Shortness of Breath    HPI Erica Pierce is a 46 y.o. female.   HPI  Hermenia here for "slight" sharp left sided chest pain/discomfort for the past 4-5 days. Reports flutters in her heart and intermittent swelling in her feet. No history AFIB or CHF.  Chest pain comes and goes for a few minutes and resolves on its own. The fluttering keeps happening.  Drinks green tea.  No coffee or soda.  Her sister had the flu about 2 weeks ago. [name] was treated as she had symptoms and took cough meds since she had been exposed. Fluttering started a few days afterward starting the medications.  At times she feel short of breath. No vomiting. Continues to cough.   Does not follow with a cardiologist.     Past Medical History:  Diagnosis Date   Asthma    Chlamydia 1999   Chronic back pain    Gestational diabetes    Hypertension    PCOS (polycystic ovarian syndrome)    Sciatica, left side     Patient Active Problem List   Diagnosis Date Noted   Decreased fetal movement affecting management of pregnancy in third trimester 03/24/2020    Past Surgical History:  Procedure Laterality Date   CESAREAN SECTION     CHOLECYSTECTOMY      OB History     Gravida  1   Para      Term      Preterm      AB      Living         SAB      IAB      Ectopic      Multiple      Live Births               Home Medications    Prior to Admission medications   Medication Sig Start Date End Date Taking? Authorizing Provider  guaiFENesin-codeine (CHERATUSSIN AC) 100-10 MG/5ML syrup Take 5 mLs by mouth 3 (three) times daily as needed for cough. 07/08/22  Yes Maude Gloor, DO  albuterol (VENTOLIN HFA) 108 (90 Base) MCG/ACT inhaler Inhale 2 puffs into the lungs every 4 (four) hours as needed for wheezing or  shortness of breath. 06/29/22   Shirlee Latch, PA-C  cyclobenzaprine (FLEXERIL) 10 MG tablet Take 1 tablet (10 mg total) by mouth 3 (three) times daily as needed for muscle spasms. 04/21/22   Eusebio Friendly B, PA-C  ipratropium (ATROVENT) 0.06 % nasal spray Place 2 sprays into both nostrils 4 (four) times daily. 06/29/22   Eusebio Friendly B, PA-C  lidocaine (XYLOCAINE) 2 % solution Apply with q tip to gum sore q 6h prn pain 05/11/22   Rodriguez-Southworth, Nettie Elm, PA-C  magic mouthwash SOLN Take 5 mLs by mouth 4 (four) times daily as needed for mouth pain. 05/25/22   Rodriguez-Southworth, Nettie Elm, PA-C  meloxicam (MOBIC) 15 MG tablet Take 1 tablet (15 mg total) by mouth daily. 05/11/22   Rodriguez-Southworth, Nettie Elm, PA-C  metFORMIN (GLUCOPHAGE) 500 MG tablet Take by mouth 2 (two) times daily with a meal.    [provider]  naproxen (NAPROSYN) 500 MG tablet Take 1 tablet (500 mg total) by mouth 2 (two) times daily as needed for moderate pain. 04/21/22   Michiel Cowboy,  Algis Greenhouse, PA-C  NIFEdipine (PROCARDIA-XL/NIFEDICAL-XL) 30 MG 24 hr tablet Take 30 mg by mouth daily. 11/24/21   [provider]  olopatadine (PATADAY) 0.1 % ophthalmic solution Place 1 drop into both eyes 2 (two) times daily. 05/24/22   Katha Cabal, DO  oseltamivir (TAMIFLU) 75 MG capsule Take 1 capsule (75 mg total) by mouth every 12 (twelve) hours. 06/27/22   Lamptey, Britta Mccreedy, MD  trimethoprim-polymyxin b (POLYTRIM) ophthalmic solution Place 2 drops into the right eye 3 (three) times daily. For 7 days 05/25/22   Rodriguez-Southworth, Nettie Elm, PA-C    Family History Family History  Problem Relation Age of Onset   Heart failure Mother    Cancer Paternal Grandmother    Cancer Paternal Grandfather     Social History Social History   Tobacco Use   Smoking status: Former    Types: Cigarettes   Smokeless tobacco: Never  Vaping Use   Vaping Use: Never used  Substance Use Topics   Alcohol use: No   Drug use: Never      Allergies   Elemental sulfur, Sulfa antibiotics, and Shellfish allergy   Review of Systems Review of Systems :negative unless otherwise stated in HPI.      Physical Exam Triage Vital Signs ED Triage Vitals  Enc Vitals Group     BP 07/08/22 1324 (!) 140/86     Pulse Rate 07/08/22 1324 73     Resp 07/08/22 1324 14     Temp 07/08/22 1324 98.4 F (36.9 C)     Temp Source 07/08/22 1324 Oral     SpO2 07/08/22 1324 100 %     Weight 07/08/22 1323 260 lb (117.9 kg)     Height 07/08/22 1323 5\' 6"  (1.676 m)     Head Circumference --      Peak Flow --      Pain Score 07/08/22 1322 6     Pain Loc --      Pain Edu? --      Excl. in GC? --    No data found.  Updated Vital Signs BP (!) 140/86 (BP Location: Left Arm)   Pulse 73   Temp 98.4 F (36.9 C) (Oral)   Resp 14   Ht 5\' 6"  (1.676 m)   Wt 117.9 kg   LMP 06/22/2022   SpO2 100%   BMI 41.97 kg/m   Visual Acuity Right Eye Distance:   Left Eye Distance:   Bilateral Distance:    Right Eye Near:   Left Eye Near:    Bilateral Near:     Physical Exam  GEN: well appearing female, in no acute distress  CV: regular rate and rhythm,  no murmurs, rubs or gallops  appreciated,, no JVP  CHEST WALL: non-tender to palpation  RESP: no increased work of breathing, clear to ascultation bilaterally MSK: no ankle or LE  edema, o calf tenderness SKIN: warm, dry, no rash on visible skin NEURO: alert, moves all extremities appropriately PSYCH: Normal affect, appropriate speech and behavior   UC Treatments / Results  Labs (all labs ordered are listed, but only abnormal results are displayed) Labs Reviewed  BASIC METABOLIC PANEL - Abnormal; Notable for the following components:      Result Value   Calcium 8.8 (*)    All other components within normal limits  CBC WITH DIFFERENTIAL/PLATELET - Abnormal; Notable for the following components:   WBC 11.2 (*)    RBC 5.61 (*)    Hemoglobin 11.0 (*)  MCV 65.8 (*)    MCH 19.6 (*)     MCHC 29.8 (*)    RDW 17.9 (*)    Platelets 484 (*)    Lymphs Abs 5.7 (*)    Abs Immature Granulocytes 0.15 (*)    All other components within normal limits  TSH  BRAIN NATRIURETIC PEPTIDE  TROPONIN I (HIGH SENSITIVITY)    EKG   Radiology DG Chest 2 View  Result Date: 07/08/2022 CLINICAL DATA:  Shortness of breath, chest pain. EXAM: CHEST - 2 VIEW COMPARISON:  April 21, 2022. FINDINGS: The heart size and mediastinal contours are within normal limits. Both lungs are clear. The visualized skeletal structures are unremarkable. IMPRESSION: No active cardiopulmonary disease. Electronically Signed   By: Lupita Raider M.D.   On: 07/08/2022 14:41    Procedures Procedures (including critical care time)  Medications Ordered in UC Medications - No data to display  Initial Impression / Assessment and Plan / UC Course  I have reviewed the triage vital signs and the nursing notes.  Pertinent labs & imaging results that were available during my care of the patient were reviewed by me and considered in my medical decision making (see chart for details).       Patient is a 46 y.o. female with history of anxiety  who presents with palpitations and left sided chest pain for the past 4 to 5 days. Akanksha has no history of PE.  Able to [facility] pt out therefore doubt PE.  EKG obtained and is without ST elevations or concern for ischemia, NSR.  CBC unremarkable for significant anemia (Hgb 11.0).  There were no significant electrolyte abnormalitis seen on BMP. Initial hsTrop was negative.  I do not think his chest pain is cardiopulmonary related. No GERD symptoms. No other anxiety signs or symptoms. Exam not concerning for costochondritis however pain is reproducible, on exam.  This does not appear to be MSK related.  BNP normal and TSH is normal. She was recently prescribed dextromethorphan for the flu which can cause palpitations.  Searches advised her to stop taking this medication.  Switch to  Cheratussin.  Prescription sent to the pharmacy.   Discussed MDM, treatment plan and plan for follow-up with patient/parent who agrees with plan.      Final Clinical Impressions(s) / UC Diagnoses   Final diagnoses:  Atypical chest pain  Shortness of breath     Discharge Instructions      Your EKG did not show cause for concern.  Your chest xray was normal. I do not this this is heart or lung related.    Stop drink green tea and and avoid caffeinated beverages as these could be a cause of your palpitations.   Go to ED for red flag symptoms, including; fevers you cannot reduce with Tylenol/Motrin, severe headaches, vision changes, numbness/weakness in part of the body, lethargy, confusion, intractable vomiting, severe dehydration, chest pain, breathing difficulty, severe persistent abdominal or pelvic pain, signs of severe infection (increased redness, swelling of an area), feeling faint or passing out, dizziness, etc. You should especially go to the ED for sudden acute worsening of condition if you do not elect to go at this time.       ED Prescriptions     Medication Sig Dispense Auth. Provider   guaiFENesin-codeine (CHERATUSSIN AC) 100-10 MG/5ML syrup Take 5 mLs by mouth 3 (three) times daily as needed for cough. 120 mL Kamron Vanwyhe, Seward Meth, DO      I have reviewed the  PDMP during this encounter.   Katha CabalBrimage, Tawona Filsinger, DO 07/08/22 2058

## 2022-11-01 ENCOUNTER — Other Ambulatory Visit: Payer: Self-pay

## 2022-11-01 ENCOUNTER — Ambulatory Visit
Admission: EM | Admit: 2022-11-01 | Discharge: 2022-11-01 | Disposition: A | Discharge status: Home / Self Care | Payer: Medicaid Other | Attending: Internal Medicine | Admitting: Internal Medicine

## 2022-11-01 DIAGNOSIS — J3089 Other allergic rhinitis: Secondary | ICD-10-CM | POA: Insufficient documentation

## 2022-11-01 DIAGNOSIS — J039 Acute tonsillitis, unspecified: Secondary | ICD-10-CM | POA: Diagnosis present

## 2022-11-01 DIAGNOSIS — J4520 Mild intermittent asthma, uncomplicated: Secondary | ICD-10-CM

## 2022-11-01 LAB — GROUP A STREP BY PCR: Group A Strep by PCR: NOT DETECTED

## 2022-11-01 MED ORDER — PREDNISONE 20 MG PO TABS: 20.0000 mg | ORAL_TABLET | Freq: Every day | ORAL | 0 refills | Status: DC

## 2022-11-01 MED ORDER — AMOXICILLIN 500 MG PO CAPS: 500.0000 mg | ORAL_CAPSULE | Freq: Two times a day (BID) | ORAL | 0 refills | Status: DC

## 2022-11-01 MED ORDER — FEXOFENADINE HCL 180 MG PO TABS: 180.0000 mg | ORAL_TABLET | Freq: Every day | ORAL | 0 refills | Status: DC

## 2022-11-01 MED ORDER — BENZONATATE 200 MG PO CAPS: 200.0000 mg | ORAL_CAPSULE | Freq: Three times a day (TID) | ORAL | 0 refills | Status: DC | PRN

## 2022-11-01 MED ORDER — ALBUTEROL SULFATE HFA 108 (90 BASE) MCG/ACT IN AERS: 2.0000 | INHALATION_SPRAY | RESPIRATORY_TRACT | 0 refills | Status: AC | PRN

## 2022-11-01 NOTE — ED Provider Notes (Addendum)
[facility]    CSN: [id number] Arrival date & time: 11/01/22  1545      History   Chief Complaint Chief Complaint  Patient presents with   Sore Throat   Otalgia    HPI Erica Pierce is a 47 y.o. female who presents with ST that feels is burning, had white spots on tonsils, cough, post nasal drainage and sneezing x 2 days. She took  2 days of left over amoxicillin from her husband and seemed to help a little.     Past Medical History:  Diagnosis Date   Asthma    Chlamydia 1999   Chronic back pain    Gestational diabetes    Hypertension    PCOS (polycystic ovarian syndrome)    Sciatica, left side     Patient Active Problem List   Diagnosis Date Noted   Decreased fetal movement affecting management of pregnancy in third trimester 03/24/2020    Past Surgical History:  Procedure Laterality Date   CESAREAN SECTION     CHOLECYSTECTOMY      OB History     Gravida  1   Para      Term      Preterm      AB      Living         SAB      IAB      Ectopic      Multiple      Live Births               Home Medications    Prior to Admission medications   Medication Sig Start Date End Date Taking? Authorizing Provider  amoxicillin (AMOXIL) 500 MG capsule Take 1 capsule (500 mg total) by mouth 2 (two) times daily. 11/01/22  Yes Rodriguez-Southworth, Nettie Elm, PA-C  benzonatate (TESSALON) 200 MG capsule Take 1 capsule (200 mg total) by mouth 3 (three) times daily as needed for cough. 11/01/22  Yes Rodriguez-Southworth, Nettie Elm, PA-C  fexofenadine (ALLEGRA ALLERGY) 180 MG tablet Take 1 tablet (180 mg total) by mouth daily. 11/01/22  Yes Rodriguez-Southworth, Nettie Elm, PA-C  predniSONE (DELTASONE) 20 MG tablet Take 1 tablet (20 mg total) by mouth daily with breakfast. 11/01/22  Yes Rodriguez-Southworth, Nettie Elm, PA-C  albuterol (VENTOLIN HFA) 108 (90 Base) MCG/ACT inhaler Inhale 2 puffs into the lungs every 4 (four) hours as needed for wheezing or  shortness of breath. 11/01/22   Rodriguez-Southworth, Nettie Elm, PA-C  cyclobenzaprine (FLEXERIL) 10 MG tablet Take 1 tablet (10 mg total) by mouth 3 (three) times daily as needed for muscle spasms. 04/21/22   Shirlee Latch, PA-C  guaiFENesin-codeine (CHERATUSSIN AC) 100-10 MG/5ML syrup Take 5 mLs by mouth 3 (three) times daily as needed for cough. 07/08/22   Brimage, Seward Meth, DO  ipratropium (ATROVENT) 0.06 % nasal spray Place 2 sprays into both nostrils 4 (four) times daily. 06/29/22   Eusebio Friendly B, PA-C  lidocaine (XYLOCAINE) 2 % solution Apply with q tip to gum sore q 6h prn pain 05/11/22   Rodriguez-Southworth, Nettie Elm, PA-C  magic mouthwash SOLN Take 5 mLs by mouth 4 (four) times daily as needed for mouth pain. 05/25/22   Rodriguez-Southworth, Nettie Elm, PA-C  meloxicam (MOBIC) 15 MG tablet Take 1 tablet (15 mg total) by mouth daily. 05/11/22   Rodriguez-Southworth, Nettie Elm, PA-C  metFORMIN (GLUCOPHAGE) 500 MG tablet Take by mouth 2 (two) times daily with a meal.    [provider]  naproxen (NAPROSYN) 500 MG tablet Take 1 tablet (500 mg  total) by mouth 2 (two) times daily as needed for moderate pain. 04/21/22   Eusebio Friendly B, PA-C  NIFEdipine (PROCARDIA-XL/NIFEDICAL-XL) 30 MG 24 hr tablet Take 30 mg by mouth daily. 11/24/21   [provider]  olopatadine (PATADAY) 0.1 % ophthalmic solution Place 1 drop into both eyes 2 (two) times daily. 05/24/22   Katha Cabal, DO  oseltamivir (TAMIFLU) 75 MG capsule Take 1 capsule (75 mg total) by mouth every 12 (twelve) hours. 06/27/22   Lamptey, Britta Mccreedy, MD  trimethoprim-polymyxin b (POLYTRIM) ophthalmic solution Place 2 drops into the right eye 3 (three) times daily. For 7 days 05/25/22   Rodriguez-Southworth, Nettie Elm, PA-C    Family History Family History  Problem Relation Age of Onset   Heart failure Mother    Cancer Paternal Grandmother    Cancer Paternal Grandfather     Social History Social History   Tobacco Use   Smoking  status: Former    Types: Cigarettes   Smokeless tobacco: Never  Vaping Use   Vaping Use: Never used  Substance Use Topics   Alcohol use: No   Drug use: Never     Allergies   Elemental sulfur, Sulfa antibiotics, and Shellfish allergy   Review of Systems Review of Systems As noted in HPI  Physical Exam Triage Vital Signs ED Triage Vitals  Enc Vitals Group     BP 11/01/22 1559 129/83     Pulse Rate 11/01/22 1559 81     Resp 11/01/22 1559 20     Temp 11/01/22 1559 98.3 F (36.8 C)     Temp src --      SpO2 11/01/22 1559 100 %     Weight --      Height --      Head Circumference --      Peak Flow --      Pain Score 11/01/22 1555 10     Pain Loc --      Pain Edu? --      Excl. in GC? --    No data found.  Updated Vital Signs BP 129/83   Pulse 81   Temp 98.3 F (36.8 C)   Resp 20   LMP 10/25/2022 (Exact Date)   SpO2 100%   Visual Acuity Right Eye Distance:   Left Eye Distance:   Bilateral Distance:    Right Eye Near:   Left Eye Near:    Bilateral Near:     Physical Exam Physical Exam Vitals signs and nursing note reviewed.  Constitutional:      General: She is not in acute distress.    Appearance: Normal appearance. She is not  ill-appearing or toxic-appearing.   HENT:     Head: Normocephalic.     Right Ear: Tympanic membrane, ear canal and external ear normal.     Left Ear: Tympanic membrane, ear canal and external ear normal.     Nose: with mild congestion and pink pale mucosa    Mouth/Throat: clear    Mouth: Mucous membranes are moist.  Eyes:     General: No scleral icterus.       Right eye: No discharge.        Left eye: No discharge.     Conjunctiva/sclera: Conjunctivae normal.  Neck:     Musculoskeletal: Neck supple. No neck rigidity.  Cardiovascular:     Rate and Rhythm: Normal rate and regular rhythm.     Heart sounds: No murmur.  Pulmonary: has intermittent dry cough  Effort: Pulmonary effort is normal.     Breath sounds: Normal  breath sounds.  Abdominal:     General: Bowel sounds are normal. There is no distension.     Palpations: Abdomen is soft. There is no mass.     Tenderness: There is no abdominal tenderness. There is no guarding or rebound.     Hernia: No hernia is present.  Musculoskeletal: Normal range of motion.  Lymphadenopathy:     Cervical: No cervical adenopathy.  Skin:    General: Skin is warm and dry.     Coloration: Skin is not jaundiced.     Findings: No rash.  Neurological:     Mental Status: She is alert and oriented to person, place, and time.     Gait: Gait normal.  Psychiatric:        Mood and Affect: Mood normal.        Behavior: Behavior normal.        Thought Content: Thought content normal.        Judgment: Judgment normal.    UC Treatments / Results  Labs (all labs ordered are listed, but only abnormal results are displayed) Labs Reviewed  GROUP A STREP BY PCR  Strep PCR is negative  EKG   Radiology No results found.  Procedures Procedures (including critical care time)  Medications Ordered in UC Medications - No data to display  Initial Impression / Assessment and Plan / UC Course  I have reviewed the triage vital signs and the nursing notes.  Pertinent labs results that were available during my care of the patient were reviewed by me and considered in my medical decision making (see chart for details).  Tonsillitis  Allergic rhinitis Acute asthma  I refilled her Albuterol inhaler as noted. She was also placed on Tessalon, Allegra and Prednisone as noted.  Since she has been taking Amoxicillin and the exudate on her tonsils are gone, I will have her finish 8 more days to complete the full 10 days.    Final Clinical Impressions(s) / UC Diagnoses   Final diagnoses:  Acute tonsillitis, unspecified etiology  Seasonal allergic rhinitis due to other allergic trigger  Mild intermittent extrinsic asthma without complication     Discharge Instructions       Use the Albuterol inhaler for cough attack which is being triggered by the allergies and drainage     ED Prescriptions     Medication Sig Dispense Auth. Provider   fexofenadine (ALLEGRA ALLERGY) 180 MG tablet Take 1 tablet (180 mg total) by mouth daily. 30 tablet Rodriguez-Southworth, Davyn Morandi, PA-C   benzonatate (TESSALON) 200 MG capsule Take 1 capsule (200 mg total) by mouth 3 (three) times daily as needed for cough. 30 capsule Rodriguez-Southworth, Nettie Elm, PA-C   predniSONE (DELTASONE) 20 MG tablet Take 1 tablet (20 mg total) by mouth daily with breakfast. 5 tablet Rodriguez-Southworth, Nettie Elm, PA-C   albuterol (VENTOLIN HFA) 108 (90 Base) MCG/ACT inhaler Inhale 2 puffs into the lungs every 4 (four) hours as needed for wheezing or shortness of breath. 1 g Rodriguez-Southworth, Nettie Elm, PA-C   amoxicillin (AMOXIL) 500 MG capsule Take 1 capsule (500 mg total) by mouth 2 (two) times daily. 14 capsule Rodriguez-Southworth, Nettie Elm, PA-C      PDMP not reviewed this encounter.   Garey Ham, PA-C 11/01/22 1656    Rodriguez-Southworth, Nettie Elm, PA-C 11/01/22 1658

## 2022-11-01 NOTE — Discharge Instructions (Signed)
Use the Albuterol inhaler for cough attack which is being triggered by the allergies and drainage

## 2022-11-01 NOTE — ED Triage Notes (Signed)
Sore throat and bilat ear pain x 2 days

## 2022-12-09 ENCOUNTER — Ambulatory Visit
Admission: EM | Admit: 2022-12-09 | Discharge: 2022-12-09 | Disposition: A | Discharge status: Home / Self Care | Payer: Medicaid Other | Attending: Emergency Medicine | Admitting: Emergency Medicine

## 2022-12-09 ENCOUNTER — Encounter: Payer: Self-pay | Admitting: Emergency Medicine

## 2022-12-09 DIAGNOSIS — M5410 Radiculopathy, site unspecified: Secondary | ICD-10-CM | POA: Diagnosis present

## 2022-12-09 LAB — URINALYSIS, W/ REFLEX TO CULTURE (INFECTION SUSPECTED)
Bilirubin Urine: NEGATIVE
Glucose, UA: NEGATIVE mg/dL
Ketones, ur: NEGATIVE mg/dL
Leukocytes,Ua: NEGATIVE
Nitrite: NEGATIVE
Protein, ur: NEGATIVE mg/dL
Specific Gravity, Urine: 1.015 (ref 1.005–1.030)
pH: 7 (ref 5.0–8.0)

## 2022-12-09 MED ORDER — KETOROLAC TROMETHAMINE 30 MG/ML IJ SOLN
30.0000 mg | Freq: Once | INTRAMUSCULAR | Status: AC
  Administered 2022-12-09: 30 mg via INTRAMUSCULAR

## 2022-12-09 MED ORDER — METHYLPREDNISOLONE ACETATE 80 MG/ML IJ SUSP
60.0000 mg | Freq: Once | INTRAMUSCULAR | Status: AC
  Administered 2022-12-09: 60 mg via INTRAMUSCULAR

## 2022-12-09 MED ORDER — PREDNISONE 50 MG PO TABS: ORAL_TABLET | ORAL | 0 refills | Status: DC

## 2022-12-09 MED ORDER — METHYLPREDNISOLONE SODIUM SUCC 40 MG IJ SOLR: 60.0000 mg | Freq: Once | INTRAMUSCULAR | Status: DC

## 2022-12-09 NOTE — ED Triage Notes (Signed)
Patient states that she has two slip disc.  Patient reports ongoing mid back pain for "ever"  Patient states that pain has started going down her legs for the past 2 weeks.

## 2022-12-09 NOTE — Discharge Instructions (Signed)
Today you are being treated for your back pain  Urinalysis is negative for infection  You have been given an injection of Toradol and prednisone in office, daily you will start to see improvement in about 30 minutes  Starting tomorrow take prednisone every morning with food for 5 days to continue to reduce inflammation and ideally help with your pain, stop use of diclofenac (Voltaren) tablets while taking medicine, may continue all other medications  May use ice or heat over the affected area in 10 to 15-minute intervals  Massage and stretch as tolerated  May place pillows behind back and underneath the legs for additional support and comfort  Please notify your neurologist of urinary leakage and follow-up with neurologist if symptoms continue to persist or worsen

## 2022-12-09 NOTE — ED Provider Notes (Signed)
[facility]    CSN: [id number] Arrival date & time: 12/09/22  1844      History   Chief Complaint Chief Complaint  Patient presents with   Back Pain    HPI Erica Pierce is a 47 y.o. female.   Patient presents for evaluation of centralized upper and lower.  Pain is worsening in severity 2 weeks ago.  Pain radiates down the posterior of the bilateral legs into the toes, described as sharp, severe and cramping, rated a 10 out of 10.  Patient has known slipped disc and is followed by neurology, currently taking gabapentin, Voltaren and Flexeril for management.  Has referral placed to physical therapy to start in 1 to 2 months.  Has begun to experience urinary frequency and incontinence over the last 2 weeks denies hematuria, dysuria.    Past Medical History:  Diagnosis Date   Asthma    Chlamydia 1999   Chronic back pain    Gestational diabetes    Hypertension    PCOS (polycystic ovarian syndrome)    Sciatica, left side     Patient Active Problem List   Diagnosis Date Noted   Decreased fetal movement affecting management of pregnancy in third trimester 03/24/2020    Past Surgical History:  Procedure Laterality Date   CESAREAN SECTION     CHOLECYSTECTOMY      OB History     Gravida  1   Para      Term      Preterm      AB      Living         SAB      IAB      Ectopic      Multiple      Live Births               Home Medications    Prior to Admission medications   Medication Sig Start Date End Date Taking? Authorizing Provider  albuterol (VENTOLIN HFA) 108 (90 Base) MCG/ACT inhaler Inhale 2 puffs into the lungs every 4 (four) hours as needed for wheezing or shortness of breath. 11/01/22   Rodriguez-Southworth, Nettie Elm, PA-C  amoxicillin (AMOXIL) 500 MG capsule Take 1 capsule (500 mg total) by mouth 2 (two) times daily. 11/01/22   Rodriguez-Southworth, Nettie Elm, PA-C  benzonatate (TESSALON) 200 MG capsule Take 1 capsule (200 mg total)  by mouth 3 (three) times daily as needed for cough. 11/01/22   Rodriguez-Southworth, Nettie Elm, PA-C  cyclobenzaprine (FLEXERIL) 10 MG tablet Take 1 tablet (10 mg total) by mouth 3 (three) times daily as needed for muscle spasms. 04/21/22   Shirlee Latch, PA-C  fexofenadine (ALLEGRA ALLERGY) 180 MG tablet Take 1 tablet (180 mg total) by mouth daily. 11/01/22   Rodriguez-Southworth, Nettie Elm, PA-C  guaiFENesin-codeine (CHERATUSSIN AC) 100-10 MG/5ML syrup Take 5 mLs by mouth 3 (three) times daily as needed for cough. 07/08/22   Brimage, Seward Meth, DO  ipratropium (ATROVENT) 0.06 % nasal spray Place 2 sprays into both nostrils 4 (four) times daily. 06/29/22   Eusebio Friendly B, PA-C  lidocaine (XYLOCAINE) 2 % solution Apply with q tip to gum sore q 6h prn pain 05/11/22   Rodriguez-Southworth, Nettie Elm, PA-C  magic mouthwash SOLN Take 5 mLs by mouth 4 (four) times daily as needed for mouth pain. 05/25/22   Rodriguez-Southworth, Nettie Elm, PA-C  meloxicam (MOBIC) 15 MG tablet Take 1 tablet (15 mg total) by mouth daily. 05/11/22   Rodriguez-Southworth, Nettie Elm, PA-C  metFORMIN (GLUCOPHAGE) 500 MG  tablet Take by mouth 2 (two) times daily with a meal.    [provider]  naproxen (NAPROSYN) 500 MG tablet Take 1 tablet (500 mg total) by mouth 2 (two) times daily as needed for moderate pain. 04/21/22   Eusebio Friendly B, PA-C  NIFEdipine (PROCARDIA-XL/NIFEDICAL-XL) 30 MG 24 hr tablet Take 30 mg by mouth daily. 11/24/21   [provider]  olopatadine (PATADAY) 0.1 % ophthalmic solution Place 1 drop into both eyes 2 (two) times daily. 05/24/22   Katha Cabal, DO  oseltamivir (TAMIFLU) 75 MG capsule Take 1 capsule (75 mg total) by mouth every 12 (twelve) hours. 06/27/22   Merrilee Jansky, MD  predniSONE (DELTASONE) 20 MG tablet Take 1 tablet (20 mg total) by mouth daily with breakfast. 11/01/22   Rodriguez-Southworth, Nettie Elm, PA-C  trimethoprim-polymyxin b (POLYTRIM) ophthalmic solution Place 2 drops into the right  eye 3 (three) times daily. For 7 days 05/25/22   Rodriguez-Southworth, Nettie Elm, PA-C    Family History Family History  Problem Relation Age of Onset   Heart failure Mother    Cancer Paternal Grandmother    Cancer Paternal Grandfather     Social History Social History   Tobacco Use   Smoking status: Former    Types: Cigarettes   Smokeless tobacco: Never  Vaping Use   Vaping Use: Never used  Substance Use Topics   Alcohol use: No   Drug use: Never     Allergies   Elemental sulfur, Sulfa antibiotics, and Shellfish allergy   Review of Systems Review of Systems  Musculoskeletal:  Positive for back pain.     Physical Exam Triage Vital Signs ED Triage Vitals  Enc Vitals Group     BP 12/09/22 1909 111/68     Pulse Rate 12/09/22 1909 79     Resp 12/09/22 1909 14     Temp 12/09/22 1909 98.2 F (36.8 C)     Temp Source 12/09/22 1909 Oral     SpO2 12/09/22 1909 99 %     Weight 12/09/22 1906 239 lb (108.4 kg)     Height 12/09/22 1906 5\' 6"  (1.676 m)     Head Circumference --      Peak Flow --      Pain Score 12/09/22 1906 10     Pain Loc --      Pain Edu? --      Excl. in GC? --    No data found.  Updated Vital Signs BP 111/68 (BP Location: Left Arm)   Pulse 79   Temp 98.2 F (36.8 C) (Oral)   Resp 14   Ht 5\' 6"  (1.676 m)   Wt 239 lb (108.4 kg)   LMP 11/25/2022 (Approximate)   SpO2 99%   BMI 38.58 kg/m   Visual Acuity Right Eye Distance:   Left Eye Distance:   Bilateral Distance:    Right Eye Near:   Left Eye Near:    Bilateral Near:     Physical Exam Constitutional:      Appearance: Normal appearance.  Eyes:     Extraocular Movements: Extraocular movements intact.  Pulmonary:     Effort: Pulmonary effort is normal.  Musculoskeletal:     Comments: Centralized tenderness is present to the thoracic and lumbar region, able to twist turn and bend, able to sit erect without complication, positive straight leg test bilaterally  Neurological:      Mental Status: She is alert and oriented to person, place, and time. Mental status is  at baseline.      UC Treatments / Results  Labs (all labs ordered are listed, but only abnormal results are displayed) Labs Reviewed  URINALYSIS, W/ REFLEX TO CULTURE (INFECTION SUSPECTED)    EKG   Radiology No results found.  Procedures Procedures (including critical care time)  Medications Ordered in UC Medications - No data to display  Initial Impression / Assessment and Plan / UC Course  I have reviewed the triage vital signs and the nursing notes.  Pertinent labs & imaging results that were available during my care of the patient were reviewed by me and considered in my medical decision making (see chart for details).  Radiculitis  Symptoms less likely related to patient's current condition, urinalysis negative for infection, Toradol and methylprednisolone injection given in office and prescribed prednisone burst for outpatient recommended RICE, heat massage stretching and activity as tolerated for additional supportive care, advised patient to notify neurologist of incontinence episodes and advised to follow-up for further evaluation and management Final Clinical Impressions(s) / UC Diagnoses   Final diagnoses:  None   Discharge Instructions   None    ED Prescriptions   None    PDMP not reviewed this encounter.   Valinda Hoar, NP 12/09/22 1943

## 2022-12-30 ENCOUNTER — Ambulatory Visit
Admission: EM | Admit: 2022-12-30 | Discharge: 2022-12-30 | Disposition: A | Discharge status: Home / Self Care | Payer: Medicaid Other | Attending: Emergency Medicine | Admitting: Emergency Medicine

## 2022-12-30 ENCOUNTER — Encounter: Payer: Self-pay | Admitting: Emergency Medicine

## 2022-12-30 DIAGNOSIS — M545 Low back pain, unspecified: Secondary | ICD-10-CM

## 2022-12-30 DIAGNOSIS — G8929 Other chronic pain: Secondary | ICD-10-CM

## 2022-12-30 DIAGNOSIS — N390 Urinary tract infection, site not specified: Secondary | ICD-10-CM | POA: Diagnosis present

## 2022-12-30 LAB — URINALYSIS, W/ REFLEX TO CULTURE (INFECTION SUSPECTED)
Bilirubin Urine: NEGATIVE
Glucose, UA: NEGATIVE mg/dL
Ketones, ur: NEGATIVE mg/dL
Leukocytes,Ua: NEGATIVE
Nitrite: NEGATIVE
Protein, ur: NEGATIVE mg/dL
Specific Gravity, Urine: >1.03 — ABNORMAL HIGH (ref 1.005–1.030)
pH: 5.5 (ref 5.0–8.0)

## 2022-12-30 LAB — WET PREP, GENITAL
Clue Cells Wet Prep HPF POC: NONE SEEN
Sperm: NONE SEEN
Trich, Wet Prep: NONE SEEN
WBC, Wet Prep HPF POC: >10 — AB (ref <10)
Yeast Wet Prep HPF POC: NONE SEEN

## 2022-12-30 MED ORDER — BACLOFEN 10 MG PO TABS: 10.0000 mg | ORAL_TABLET | Freq: Three times a day (TID) | ORAL | 0 refills | Status: DC

## 2022-12-30 MED ORDER — NITROFURANTOIN MONOHYD MACRO 100 MG PO CAPS: 100.0000 mg | ORAL_CAPSULE | Freq: Two times a day (BID) | ORAL | 0 refills | Status: DC

## 2022-12-30 NOTE — Discharge Instructions (Addendum)
Take the Macrobid twice daily for 5 days with food for treatment of urinary tract infection.  Increase your oral fluid intake so that you increase your urine production and or flushing your urinary system.  Take an over-the-counter probiotic, such as Culturelle-Align-Activia, 1 hour after each dose of antibiotic to prevent diarrhea or yeast infections from forming.  I do believe that some of your back pain is being caused by muscle tension.  Use the baclofen 10 mg every 8 hours to help with muscle tension.  You may also use over-the-counter ibuprofen according to package instructions to help with pain.  Return for reevaluation, or see your primary care provider, for any new or worsening symptoms.

## 2022-12-30 NOTE — ED Triage Notes (Signed)
Patient with c/o clear vaginal discharge. States she also has lower abdominal pain and back pain. Denies itching or pain in vaginal area.

## 2022-12-30 NOTE — ED Provider Notes (Signed)
[facility]    CSN: [id number] Arrival date & time: 12/30/22  1724      History   Chief Complaint Chief Complaint  Patient presents with   Vaginal Discharge    HPI Erica Pierce is a 47 y.o. female.   HPI  47 year old female with a past medical history of hypertension, PCOS, left-sided sciatica, chronic back pain, asthma, and chlamydia presents for evaluation of clear vaginal discharge that started today.  She is also been experiencing some suprapubic abdominal pain and low back pain.  Low back pain has been ongoing and she has disc issues in her back.  She also endorses urinary urgency and frequency but she denies dysuria or hematuria.  She also denies fever.  When asked if she had concerns for sexually transmitted infections she denied concerns.  Past Medical History:  Diagnosis Date   Asthma    Chlamydia 1999   Chronic back pain    Gestational diabetes    Hypertension    PCOS (polycystic ovarian syndrome)    Sciatica, left side     Patient Active Problem List   Diagnosis Date Noted   Decreased fetal movement affecting management of pregnancy in third trimester 03/24/2020    Past Surgical History:  Procedure Laterality Date   CESAREAN SECTION     CHOLECYSTECTOMY      OB History     Gravida  1   Para      Term      Preterm      AB      Living         SAB      IAB      Ectopic      Multiple      Live Births               Home Medications    Prior to Admission medications   Medication Sig Start Date End Date Taking? Authorizing Provider  baclofen (LIORESAL) 10 MG tablet Take 1 tablet (10 mg total) by mouth 3 (three) times daily. 12/30/22  Yes Becky Augusta, NP  nitrofurantoin, macrocrystal-monohydrate, (MACROBID) 100 MG capsule Take 1 capsule (100 mg total) by mouth 2 (two) times daily. 12/30/22  Yes Becky Augusta, NP  albuterol (VENTOLIN HFA) 108 (90 Base) MCG/ACT inhaler Inhale 2 puffs into the lungs every 4 (four) hours as  needed for wheezing or shortness of breath. 11/01/22   Rodriguez-Southworth, Nettie Elm, PA-C  amoxicillin (AMOXIL) 500 MG capsule Take 1 capsule (500 mg total) by mouth 2 (two) times daily. 11/01/22   Rodriguez-Southworth, Nettie Elm, PA-C  benzonatate (TESSALON) 200 MG capsule Take 1 capsule (200 mg total) by mouth 3 (three) times daily as needed for cough. 11/01/22   Rodriguez-Southworth, Nettie Elm, PA-C  cyclobenzaprine (FLEXERIL) 10 MG tablet Take 1 tablet (10 mg total) by mouth 3 (three) times daily as needed for muscle spasms. 04/21/22   Shirlee Latch, PA-C  fexofenadine (ALLEGRA ALLERGY) 180 MG tablet Take 1 tablet (180 mg total) by mouth daily. 11/01/22   Rodriguez-Southworth, Nettie Elm, PA-C  guaiFENesin-codeine (CHERATUSSIN AC) 100-10 MG/5ML syrup Take 5 mLs by mouth 3 (three) times daily as needed for cough. 07/08/22   Brimage, Seward Meth, DO  ipratropium (ATROVENT) 0.06 % nasal spray Place 2 sprays into both nostrils 4 (four) times daily. 06/29/22   Eusebio Friendly B, PA-C  lidocaine (XYLOCAINE) 2 % solution Apply with q tip to gum sore q 6h prn pain 05/11/22   Rodriguez-Southworth, Nettie Elm, PA-C  magic mouthwash SOLN  Take 5 mLs by mouth 4 (four) times daily as needed for mouth pain. 05/25/22   Rodriguez-Southworth, Nettie Elm, PA-C  meloxicam (MOBIC) 15 MG tablet Take 1 tablet (15 mg total) by mouth daily. 05/11/22   Rodriguez-Southworth, Nettie Elm, PA-C  metFORMIN (GLUCOPHAGE) 500 MG tablet Take by mouth 2 (two) times daily with a meal.    [provider]  naproxen (NAPROSYN) 500 MG tablet Take 1 tablet (500 mg total) by mouth 2 (two) times daily as needed for moderate pain. 04/21/22   Eusebio Friendly B, PA-C  NIFEdipine (PROCARDIA-XL/NIFEDICAL-XL) 30 MG 24 hr tablet Take 30 mg by mouth daily. 11/24/21   [provider]  olopatadine (PATADAY) 0.1 % ophthalmic solution Place 1 drop into both eyes 2 (two) times daily. 05/24/22   Katha Cabal, DO  oseltamivir (TAMIFLU) 75 MG capsule Take 1 capsule (75 mg  total) by mouth every 12 (twelve) hours. 06/27/22   Merrilee Jansky, MD  predniSONE (DELTASONE) 50 MG tablet Take every morning with food for 5 days 12/09/22   Valinda Hoar, NP  trimethoprim-polymyxin b (POLYTRIM) ophthalmic solution Place 2 drops into the right eye 3 (three) times daily. For 7 days 05/25/22   Rodriguez-Southworth, Nettie Elm, PA-C    Family History Family History  Problem Relation Age of Onset   Heart failure Mother    Cancer Paternal Grandmother    Cancer Paternal Grandfather     Social History Social History   Tobacco Use   Smoking status: Former    Types: Cigarettes   Smokeless tobacco: Never  Vaping Use   Vaping Use: Never used  Substance Use Topics   Alcohol use: No   Drug use: Never     Allergies   Elemental sulfur, Sulfa antibiotics, and Shellfish allergy   Review of Systems Review of Systems  Constitutional:  Negative for fever.  Gastrointestinal:  Positive for abdominal pain.  Genitourinary:  Positive for frequency, urgency and vaginal discharge. Negative for dysuria, hematuria and vaginal pain.  Musculoskeletal:  Positive for back pain.     Physical Exam Triage Vital Signs ED Triage Vitals  Enc Vitals Group     BP      Pulse      Resp      Temp      Temp src      SpO2      Weight      Height      Head Circumference      Peak Flow      Pain Score      Pain Loc      Pain Edu?      Excl. in GC?    No data found.  Updated Vital Signs BP 119/78 (BP Location: Left Arm)   Pulse 82   Temp 98.6 F (37 C) (Oral)   Resp 18   LMP 12/17/2022 (Approximate)   SpO2 96%   Visual Acuity Right Eye Distance:   Left Eye Distance:   Bilateral Distance:    Right Eye Near:   Left Eye Near:    Bilateral Near:     Physical Exam Vitals and nursing note reviewed.  Constitutional:      Appearance: Normal appearance. She is not ill-appearing.  HENT:     Head: Normocephalic and atraumatic.  Abdominal:     Palpations: Abdomen is  soft.     Tenderness: There is abdominal tenderness. There is no right CVA tenderness, left CVA tenderness, guarding or rebound.  Musculoskeletal:  General: Tenderness present. No signs of injury.  Skin:    General: Skin is warm and dry.     Capillary Refill: Capillary refill takes less than 2 seconds.     Findings: No erythema.  Neurological:     General: No focal deficit present.     Mental Status: She is alert and oriented to person, place, and time.      UC Treatments / Results  Labs (all labs ordered are listed, but only abnormal results are displayed) Labs Reviewed  WET PREP, GENITAL - Abnormal; Notable for the following components:      Result Value   WBC, Wet Prep HPF POC >10 (*)    All other components within normal limits  URINALYSIS, W/ REFLEX TO CULTURE (INFECTION SUSPECTED) - Abnormal; Notable for the following components:   APPearance CLOUDY (*)    Specific Gravity, Urine >1.030 (*)    Hgb urine dipstick TRACE (*)    Bacteria, UA MANY (*)    All other components within normal limits    EKG   Radiology No results found.  Procedures Procedures (including critical care time)  Medications Ordered in UC Medications - No data to display  Initial Impression / Assessment and Plan / UC Course  I have reviewed the triage vital signs and the nursing notes.  Pertinent labs & imaging results that were available during my care of the patient were reviewed by me and considered in my medical decision making (see chart for details).   Patient is a nontoxic-appearing 47 year old female presenting for evaluation of GU and GYN complaints as outlined HPI above.  On exam patient has no CVA tenderness but she does have floor lumbar midline tenderness with some mild lumbar spasm.  Additionally, she has some suprapubic abdominal pain with palpation but no guarding or rebound.  She does have a history of disc issues in her lumbar spine which may be contributing to the back  pain.  However, given her urinary symptoms and vaginal discharge I will check a urinalysis and vaginal wet prep to evaluate for the presence of UTI, BV, or yeast infection.  Vaginal wet prep is negative for yeast, trichomonas, or clue cells.  Urinalysis shows a cloudy appearance with a high specific gravity and trace hemoglobin.  No leukocyte esterase, nitrates, or protein.  Reflex microscopy shows skin contamination with 11-20 squamous epithelials, 6-10 WBCs, and many bacteria.  Given patient's urinary urgency and frequency I will treat her presumptively for UTI.  The urine will not reflex to culture as there is skin contamination.  I believe that some of her back pain is also musculoskeletal in nature and I will have her use over-the-counter ibuprofen and I will prescribe baclofen that she can take every 8 hours to help with muscle spasm.  If her symptoms do not improve she can return for reevaluation or see her primary care provider.   Final Clinical Impressions(s) / UC Diagnoses   Final diagnoses:  Lower urinary tract infectious disease  Chronic midline low back pain without sciatica     Discharge Instructions      Take the Macrobid twice daily for 5 days with food for treatment of urinary tract infection.  Increase your oral fluid intake so that you increase your urine production and or flushing your urinary system.  Take an over-the-counter probiotic, such as Culturelle-Align-Activia, 1 hour after each dose of antibiotic to prevent diarrhea or yeast infections from forming.  I do believe that some of your back  pain is being caused by muscle tension.  Use the baclofen 10 mg every 8 hours to help with muscle tension.  You may also use over-the-counter ibuprofen according to package instructions to help with pain.  Return for reevaluation, or see your primary care provider, for any new or worsening symptoms.      ED Prescriptions     Medication Sig Dispense Auth. Provider    nitrofurantoin, macrocrystal-monohydrate, (MACROBID) 100 MG capsule Take 1 capsule (100 mg total) by mouth 2 (two) times daily. 10 capsule Becky Augusta, NP   baclofen (LIORESAL) 10 MG tablet Take 1 tablet (10 mg total) by mouth 3 (three) times daily. 30 each Becky Augusta, NP      PDMP not reviewed this encounter.   Becky Augusta, NP 12/30/22 1839

## 2023-03-29 ENCOUNTER — Other Ambulatory Visit: Payer: Self-pay

## 2023-03-29 ENCOUNTER — Ambulatory Visit
Admission: EM | Admit: 2023-03-29 | Discharge: 2023-03-29 | Disposition: A | Discharge status: Home / Self Care | Payer: Medicaid Other | Attending: Family Medicine | Admitting: Family Medicine

## 2023-03-29 DIAGNOSIS — B9689 Other specified bacterial agents as the cause of diseases classified elsewhere: Secondary | ICD-10-CM | POA: Insufficient documentation

## 2023-03-29 DIAGNOSIS — N760 Acute vaginitis: Secondary | ICD-10-CM | POA: Insufficient documentation

## 2023-03-29 DIAGNOSIS — Z202 Contact with and (suspected) exposure to infections with a predominantly sexual mode of transmission: Secondary | ICD-10-CM | POA: Insufficient documentation

## 2023-03-29 DIAGNOSIS — B3731 Acute candidiasis of vulva and vagina: Secondary | ICD-10-CM | POA: Diagnosis not present

## 2023-03-29 DIAGNOSIS — A749 Chlamydial infection, unspecified: Secondary | ICD-10-CM | POA: Diagnosis present

## 2023-03-29 LAB — URINALYSIS, W/ REFLEX TO CULTURE (INFECTION SUSPECTED)
Bilirubin Urine: NEGATIVE
Glucose, UA: NEGATIVE mg/dL
Ketones, ur: NEGATIVE mg/dL
Nitrite: NEGATIVE
Protein, ur: NEGATIVE mg/dL
Specific Gravity, Urine: 1.02 (ref 1.005–1.030)
pH: 6.5 (ref 5.0–8.0)

## 2023-03-29 LAB — PREGNANCY, URINE: Preg Test, Ur: NEGATIVE

## 2023-03-29 LAB — WET PREP, GENITAL
Sperm: NONE SEEN
Trich, Wet Prep: NONE SEEN
WBC, Wet Prep HPF POC: >10 — AB (ref <10)

## 2023-03-29 LAB — HIV ANTIBODY (ROUTINE TESTING W REFLEX): HIV Screen 4th Generation wRfx: NONREACTIVE

## 2023-03-29 MED ORDER — METRONIDAZOLE 500 MG PO TABS: 500.0000 mg | ORAL_TABLET | Freq: Two times a day (BID) | ORAL | 0 refills | Status: DC

## 2023-03-29 MED ORDER — FLUCONAZOLE 150 MG PO TABS: 150.0000 mg | ORAL_TABLET | ORAL | 0 refills | Status: AC

## 2023-03-29 NOTE — ED Provider Notes (Signed)
[facility]    CSN: [id number] Arrival date & time: 03/29/23  1636      History   Chief Complaint Chief Complaint  Patient presents with   Urinary Frequency   Vaginal Discharge     HPI HPI Erica Pierce is a 47 y.o. female.    Erica Pierce presents for urinary frequency and urgency constantly for 2 weeks.  Has some thin white vaginal discharge.  Denies dysuria, fevers, nausea, vomiting, diarrhea, chest pain, shortness of breath, muscle or joint pain pelvic pain.  Has history of recurrent UTIs since having her son in 2021. Has some abdominal pressure after voiding.         Past Medical History:  Diagnosis Date   Asthma    Chlamydia 1999   Chronic back pain    Gestational diabetes    Hypertension    PCOS (polycystic ovarian syndrome)    Sciatica, left side     Patient Active Problem List   Diagnosis Date Noted   Decreased fetal movement affecting management of pregnancy in third trimester 03/24/2020    Past Surgical History:  Procedure Laterality Date   CESAREAN SECTION     CHOLECYSTECTOMY      OB History     Gravida  1   Para      Term      Preterm      AB      Living         SAB      IAB      Ectopic      Multiple      Live Births               Home Medications    Prior to Admission medications   Medication Sig Start Date End Date Taking? Authorizing Provider  albuterol (VENTOLIN HFA) 108 (90 Base) MCG/ACT inhaler Inhale 2 puffs into the lungs every 4 (four) hours as needed for wheezing or shortness of breath. 11/01/22  Yes Rodriguez-Southworth, Nettie Elm, PA-C  cyclobenzaprine (FLEXERIL) 10 MG tablet Take 1 tablet (10 mg total) by mouth 3 (three) times daily as needed for muscle spasms. 04/21/22  Yes Shirlee Latch, PA-C  fluconazole (DIFLUCAN) 150 MG tablet Take 1 tablet (150 mg total) by mouth once a week for 2 doses. 03/29/23 04/06/23 Yes Nivek Powley, DO  ipratropium (ATROVENT) 0.06 % nasal spray Place 2  sprays into both nostrils 4 (four) times daily. 06/29/22  Yes Eusebio Friendly B, PA-C  metroNIDAZOLE (FLAGYL) 500 MG tablet Take 1 tablet (500 mg total) by mouth 2 (two) times daily. 03/29/23  Yes Joseandres Mazer, DO  NIFEdipine (PROCARDIA-XL/NIFEDICAL-XL) 30 MG 24 hr tablet Take 30 mg by mouth daily. 11/24/21  Yes [provider]  predniSONE (DELTASONE) 50 MG tablet Take every morning with food for 5 days 12/09/22  Yes White, Elita Boone, NP  trimethoprim-polymyxin b (POLYTRIM) ophthalmic solution Place 2 drops into the right eye 3 (three) times daily. For 7 days 05/25/22  Yes Rodriguez-Southworth, Nettie Elm, PA-C  baclofen (LIORESAL) 10 MG tablet Take 1 tablet (10 mg total) by mouth 3 (three) times daily. 12/30/22   Becky Augusta, NP  benzonatate (TESSALON) 200 MG capsule Take 1 capsule (200 mg total) by mouth 3 (three) times daily as needed for cough. 11/01/22   Rodriguez-Southworth, Nettie Elm, PA-C  doxycycline (VIBRAMYCIN) 100 MG capsule Take 1 capsule (100 mg total) by mouth 2 (two) times daily for 7 days. 03/30/23 04/06/23  Becky Augusta, NP  fexofenadine Joyce Copa  ALLERGY) 180 MG tablet Take 1 tablet (180 mg total) by mouth daily. 11/01/22   Rodriguez-Southworth, Nettie Elm, PA-C  guaiFENesin-codeine (CHERATUSSIN AC) 100-10 MG/5ML syrup Take 5 mLs by mouth 3 (three) times daily as needed for cough. 07/08/22   Katha Cabal, DO  lidocaine (XYLOCAINE) 2 % solution Apply with q tip to gum sore q 6h prn pain 05/11/22   Rodriguez-Southworth, Nettie Elm, PA-C  magic mouthwash SOLN Take 5 mLs by mouth 4 (four) times daily as needed for mouth pain. 05/25/22   Rodriguez-Southworth, Nettie Elm, PA-C  meloxicam (MOBIC) 15 MG tablet Take 1 tablet (15 mg total) by mouth daily. 05/11/22   Rodriguez-Southworth, Nettie Elm, PA-C  metFORMIN (GLUCOPHAGE) 500 MG tablet Take by mouth 2 (two) times daily with a meal.    [provider]  naproxen (NAPROSYN) 500 MG tablet Take 1 tablet (500 mg total) by mouth 2 (two) times daily as  needed for moderate pain. 04/21/22   Eusebio Friendly B, PA-C  olopatadine (PATADAY) 0.1 % ophthalmic solution Place 1 drop into both eyes 2 (two) times daily. 05/24/22   Katha Cabal, DO  oseltamivir (TAMIFLU) 75 MG capsule Take 1 capsule (75 mg total) by mouth every 12 (twelve) hours. 06/27/22   Lamptey, Britta Mccreedy, MD    Family History Family History  Problem Relation Age of Onset   Heart failure Mother    Cancer Paternal Grandmother    Cancer Paternal Grandfather     Social History Social History   Tobacco Use   Smoking status: Former    Types: Cigarettes   Smokeless tobacco: Never  Vaping Use   Vaping status: Never Used  Substance Use Topics   Alcohol use: No   Drug use: Never     Allergies   Elemental sulfur, Sulfa antibiotics, and Shellfish allergy   Review of Systems Review of Systems: :negative unless otherwise stated in HPI.      Physical Exam Triage Vital Signs ED Triage Vitals  Encounter Vitals Group     BP 03/29/23 1702 115/75     Systolic BP Percentile --      Diastolic BP Percentile --      Pulse Rate 03/29/23 1700 67     Resp 03/29/23 1700 18     Temp 03/29/23 1700 98.4 F (36.9 C)     Temp src --      SpO2 03/29/23 1700 100 %     Weight --      Height --      Head Circumference --      Peak Flow --      Pain Score 03/29/23 1657 0     Pain Loc --      Pain Education --      Exclude from Growth Chart --    No data found.  Updated Vital Signs BP 115/75   Pulse 67   Temp 98.4 F (36.9 C)   Resp 18   LMP 03/19/2023   SpO2 100%   Visual Acuity Right Eye Distance:   Left Eye Distance:   Bilateral Distance:    Right Eye Near:   Left Eye Near:    Bilateral Near:     Physical Exam GEN: well appearing female in no acute distress  CVS: well perfused  RESP: speaking in full sentences without pause      UC Treatments / Results  Labs (all labs ordered are listed, but only abnormal results are displayed) Labs Reviewed  WET PREP,  GENITAL - Abnormal; Notable for  the following components:      Result Value   Yeast Wet Prep HPF POC PRESENT (*)    Clue Cells Wet Prep HPF POC PRESENT (*)    WBC, Wet Prep HPF POC >10 (*)    All other components within normal limits  URINE CULTURE - Abnormal; Notable for the following components:   Culture   (*)    Value: <10,000 COLONIES/mL INSIGNIFICANT GROWTH Performed at [facility], [address], [place], [place] [address]    All other components within normal limits  URINALYSIS, W/ REFLEX TO CULTURE (INFECTION SUSPECTED) - Abnormal; Notable for the following components:   Hgb urine dipstick TRACE (*)    Leukocytes,Ua SMALL (*)    Bacteria, UA FEW (*)    All other components within normal limits  CERVICOVAGINAL ANCILLARY ONLY   HIV ANTIBODY (ROUTINE TESTING W REFLEX)  PREGNANCY, URINE  RPR    EKG   Radiology No results found.  Procedures Procedures (including critical care time)  Medications Ordered in UC Medications - No data to display  Initial Impression / Assessment and Plan / UC Course  I have reviewed the triage vital signs and the nursing notes.  Pertinent labs & imaging results that were available during my care of the patient were reviewed by me and considered in my medical decision making (see chart for details).      Patient is a 47 y.o.Marland Kitchen female with history of IBS who presents for urinary symptoms and vaginal discharge.  Overall patient is well-appearing and afebrile.  Vital signs stable.  Urinalysis showing small leukocyte esterase and few bacteria.  Hold antibiotics for UTI for now.  Urine culture obtained.  Follow-up sensitivities and start necessary antibiotics, if needed.  Wet prep showing evidence of yeast vaginitis and bacterial vaginitis but no trichomonas.  Self care instructions given including avoiding douching Gonorrhea and Chlamydia testing obtained.   - Treatment:  Flagyl 500 BID x 7 days and advised patient to not drink alcohol  while taking this medication. Diflucan for 2 doses for  yeast infection    Return precautions including abdominal pain, fever, chills, nausea, or vomiting given. Discussed MDM, treatment plan and plan for follow-up with patient who agrees with plan.        Final Clinical Impressions(s) / UC Diagnoses   Final diagnoses:  Bacterial vaginosis  Yeast vaginitis  Possible exposure to STD     Discharge Instructions      You had evidence of of a bacterial and yeast infections today.  Stop by the pharmacy to pick up your prescriptions.  For your UTI: Your urine was concerning for possible UTI I sent her for urine culture just to be sure.  If you need to start antibiotics for this someone will contact you.  For BV/bacterial vaginosis: Take metronidazole twice a day for the next 7 days.  Do not drink any alcohol with taking this medication.  For yeast infection: Take the first dose of Diflucan today and the last dose after you complete your antibiotics.   Your STD test results will be available in the next 72 hours. If positive, someone will contact you.  You should see your results in your MyChart account.    If your symptoms do not improve in the next 7 days, be sure to follow-up here or at your primary care provider office.  Go to the emergency department if you are having increasing pain, worsening vaginal bleeding or fever.  ED Prescriptions     Medication Sig Dispense Auth. Provider   fluconazole (DIFLUCAN) 150 MG tablet Take 1 tablet (150 mg total) by mouth once a week for 2 doses. 2 tablet Kailon Treese, DO   metroNIDAZOLE (FLAGYL) 500 MG tablet Take 1 tablet (500 mg total) by mouth 2 (two) times daily. 14 tablet Dushaun Okey, Seward Meth, DO      PDMP not reviewed this encounter.   Katha Cabal, DO 04/01/23 1311

## 2023-03-29 NOTE — Discharge Instructions (Signed)
You had evidence of of a bacterial and yeast infections today.  Stop by the pharmacy to pick up your prescriptions.  For your UTI: Your urine was concerning for possible UTI I sent her for urine culture just to be sure.  If you need to start antibiotics for this someone will contact you.  For BV/bacterial vaginosis: Take metronidazole twice a day for the next 7 days.  Do not drink any alcohol with taking this medication.  For yeast infection: Take the first dose of Diflucan today and the last dose after you complete your antibiotics.   Your STD test results will be available in the next 72 hours. If positive, someone will contact you.  You should see your results in your MyChart account.    If your symptoms do not improve in the next 7 days, be sure to follow-up here or at your primary care provider office.  Go to the emergency department if you are having increasing pain, worsening vaginal bleeding or fever.

## 2023-03-29 NOTE — ED Triage Notes (Addendum)
Urinary frequency and vaginal discharge for over 1 wk. Pt has concern for STD and would like to be tested

## 2023-03-30 ENCOUNTER — Telehealth: Payer: Self-pay | Admitting: Emergency Medicine

## 2023-03-30 LAB — CERVICOVAGINAL ANCILLARY ONLY
Chlamydia: POSITIVE — AB
Comment: NEGATIVE
Comment: NEGATIVE
Comment: NORMAL
Neisseria Gonorrhea: NEGATIVE
Trichomonas: NEGATIVE

## 2023-03-30 LAB — URINE CULTURE: Culture: <10000 — AB

## 2023-03-30 LAB — RPR: RPR Ser Ql: NONREACTIVE

## 2023-03-30 MED ORDER — DOXYCYCLINE HYCLATE 100 MG PO CAPS: 100.0000 mg | ORAL_CAPSULE | Freq: Two times a day (BID) | ORAL | 0 refills | Status: AC

## 2023-03-30 NOTE — Telephone Encounter (Signed)
Patient cervical vaginal cytology swab came back positive for chlamydia.  Prescription for doxycycline 100 mg twice daily for 7 days sent to [facility].

## 2023-04-01 ENCOUNTER — Telehealth: Payer: Self-pay | Admitting: Family Medicine

## 2023-04-01 NOTE — Telephone Encounter (Signed)
Erroneous encounter

## 2023-04-06 ENCOUNTER — Ambulatory Visit
Admission: EM | Admit: 2023-04-06 | Discharge: 2023-04-06 | Disposition: A | Discharge status: Home / Self Care | Payer: Medicaid Other | Attending: Internal Medicine | Admitting: Internal Medicine

## 2023-04-06 DIAGNOSIS — R102 Pelvic and perineal pain: Secondary | ICD-10-CM | POA: Insufficient documentation

## 2023-04-06 DIAGNOSIS — N898 Other specified noninflammatory disorders of vagina: Secondary | ICD-10-CM | POA: Insufficient documentation

## 2023-04-06 LAB — WET PREP, GENITAL
Clue Cells Wet Prep HPF POC: NONE SEEN
Sperm: NONE SEEN
Trich, Wet Prep: NONE SEEN
WBC, Wet Prep HPF POC: >=10 — AB (ref <10)
Yeast Wet Prep HPF POC: NONE SEEN

## 2023-04-06 NOTE — ED Provider Notes (Signed)
[facility]    CSN: [id number] Arrival date & time: 04/06/23  1600      History   Chief Complaint No chief complaint on file.   HPI Erica Pierce is a 47 y.o. female presents to urgent care today with complaint of pelvic pressure and persistent vaginal discharge.  She reports the discharge is brown in color.  She describes the pelvic pressure as more of a cramping.  She denies vaginal itching, irritation, vaginal odor or abnormal vaginal bleeding.  She denies urinary urgency, frequency, dysuria or blood in her urine.  She denies fever, chills or bodyaches.  She was seen 911 for the same symptoms.  Urine culture did not grow any significant bacteria.  She was negative for HIV and RPR.  Wet prep was positive for BV and yeast and she was treated with metronidazole and diflucan.  Vaginal swab positive for chlamydia and she was subsequently treated with doxycycline.  She reports she has not been sexually active since her last visit.  HPI  Past Medical History:  Diagnosis Date   Asthma    Chlamydia 1999   Chronic back pain    Gestational diabetes    Hypertension    PCOS (polycystic ovarian syndrome)    Sciatica, left side     Patient Active Problem List   Diagnosis Date Noted   Decreased fetal movement affecting management of pregnancy in third trimester 03/24/2020    Past Surgical History:  Procedure Laterality Date   CESAREAN SECTION     CHOLECYSTECTOMY      OB History     Gravida  1   Para      Term      Preterm      AB      Living         SAB      IAB      Ectopic      Multiple      Live Births               Home Medications    Prior to Admission medications   Medication Sig Start Date End Date Taking? Authorizing Provider  albuterol (VENTOLIN HFA) 108 (90 Base) MCG/ACT inhaler Inhale 2 puffs into the lungs every 4 (four) hours as needed for wheezing or shortness of breath. 11/01/22   Rodriguez-Southworth, Nettie Elm, PA-C  baclofen  (LIORESAL) 10 MG tablet Take 1 tablet (10 mg total) by mouth 3 (three) times daily. 12/30/22   Becky Augusta, NP  benzonatate (TESSALON) 200 MG capsule Take 1 capsule (200 mg total) by mouth 3 (three) times daily as needed for cough. 11/01/22   Rodriguez-Southworth, Nettie Elm, PA-C  cyclobenzaprine (FLEXERIL) 10 MG tablet Take 1 tablet (10 mg total) by mouth 3 (three) times daily as needed for muscle spasms. 04/21/22   Shirlee Latch, PA-C  doxycycline (VIBRAMYCIN) 100 MG capsule Take 1 capsule (100 mg total) by mouth 2 (two) times daily for 7 days. 03/30/23 04/06/23  Becky Augusta, NP  fexofenadine [facility] ALLERGY) 180 MG tablet Take 1 tablet (180 mg total) by mouth daily. 11/01/22   Rodriguez-Southworth, Nettie Elm, PA-C  fluconazole (DIFLUCAN) 150 MG tablet Take 1 tablet (150 mg total) by mouth once a week for 2 doses. 03/29/23 04/06/23  Brimage, Seward Meth, DO  guaiFENesin-codeine (CHERATUSSIN AC) 100-10 MG/5ML syrup Take 5 mLs by mouth 3 (three) times daily as needed for cough. 07/08/22   Brimage, Seward Meth, DO  ipratropium (ATROVENT) 0.06 % nasal spray Place 2 sprays into  both nostrils 4 (four) times daily. 06/29/22   Eusebio Friendly B, PA-C  lidocaine (XYLOCAINE) 2 % solution Apply with q tip to gum sore q 6h prn pain 05/11/22   Rodriguez-Southworth, Nettie Elm, PA-C  magic mouthwash SOLN Take 5 mLs by mouth 4 (four) times daily as needed for mouth pain. 05/25/22   Rodriguez-Southworth, Nettie Elm, PA-C  meloxicam (MOBIC) 15 MG tablet Take 1 tablet (15 mg total) by mouth daily. 05/11/22   Rodriguez-Southworth, Nettie Elm, PA-C  metFORMIN (GLUCOPHAGE) 500 MG tablet Take by mouth 2 (two) times daily with a meal.    [provider]  metroNIDAZOLE (FLAGYL) 500 MG tablet Take 1 tablet (500 mg total) by mouth 2 (two) times daily. 03/29/23   Brimage, Seward Meth, DO  naproxen (NAPROSYN) 500 MG tablet Take 1 tablet (500 mg total) by mouth 2 (two) times daily as needed for moderate pain. 04/21/22   Eusebio Friendly B, PA-C  NIFEdipine  (PROCARDIA-XL/NIFEDICAL-XL) 30 MG 24 hr tablet Take 30 mg by mouth daily. 11/24/21   [provider]  olopatadine (PATADAY) 0.1 % ophthalmic solution Place 1 drop into both eyes 2 (two) times daily. 05/24/22   Katha Cabal, DO  oseltamivir (TAMIFLU) 75 MG capsule Take 1 capsule (75 mg total) by mouth every 12 (twelve) hours. 06/27/22   Merrilee Jansky, MD  predniSONE (DELTASONE) 50 MG tablet Take every morning with food for 5 days 12/09/22   Valinda Hoar, NP  trimethoprim-polymyxin b (POLYTRIM) ophthalmic solution Place 2 drops into the right eye 3 (three) times daily. For 7 days 05/25/22   Rodriguez-Southworth, Nettie Elm, PA-C    Family History Family History  Problem Relation Age of Onset   Heart failure Mother    Cancer Paternal Grandmother    Cancer Paternal Grandfather     Social History Social History   Tobacco Use   Smoking status: Former    Types: Cigarettes   Smokeless tobacco: Never  Vaping Use   Vaping status: Never Used  Substance Use Topics   Alcohol use: No   Drug use: Never     Allergies   Elemental sulfur, Sulfa antibiotics, and Shellfish allergy   Review of Systems Review of Systems  Constitutional:  Negative for chills and fever.  Respiratory:  Negative for shortness of breath.   Cardiovascular:  Negative for chest pain.  Gastrointestinal:  Negative for diarrhea, nausea and vomiting.  Genitourinary:  Positive for pelvic pain and vaginal discharge. Negative for dysuria, frequency, hematuria, menstrual problem, urgency, vaginal bleeding and vaginal pain.  Musculoskeletal:  Negative for arthralgias and myalgias.  Neurological:  Negative for weakness, light-headedness and headaches.     Physical Exam Triage Vital Signs ED Triage Vitals  Encounter Vitals Group     BP 04/06/23 1619 115/78     Systolic BP Percentile --      Diastolic BP Percentile --      Pulse Rate 04/06/23 1619 80     Resp --      Temp 04/06/23 1619 98.6 F (37 C)      Temp Source 04/06/23 1619 Oral     SpO2 04/06/23 1619 99 %     Weight 04/06/23 1622 243 lb (110.2 kg)     Height 04/06/23 1622 5\' 6"  (1.676 m)     Head Circumference --      Peak Flow --      Pain Score 04/06/23 1621 0     Pain Loc --      Pain Education --  Exclude from Growth Chart --    No data found.  Updated Vital Signs BP 115/78 (BP Location: Left Arm)   Pulse 80   Temp 98.6 F (37 C) (Oral)   Ht 5\' 6"  (1.676 m)   Wt 243 lb (110.2 kg)   LMP 03/19/2023   SpO2 99%   BMI 39.22 kg/m       Physical Exam Constitutional:      General: She is not in acute distress.    Appearance: She is obese.  Cardiovascular:     Rate and Rhythm: Normal rate and regular rhythm.     Heart sounds: Normal heart sounds.  Pulmonary:     Effort: Pulmonary effort is normal.     Breath sounds: Normal breath sounds. No wheezing, rhonchi or rales.  Abdominal:     General: There is no distension.     Palpations: Abdomen is soft. There is no mass.     Tenderness: There is no abdominal tenderness.  Genitourinary:    Comments: Self swab Neurological:     General: No focal deficit present.     Mental Status: She is alert and oriented to person, place, and time.      UC Treatments / Results  Labs   EKG   Radiology No results found.  Procedures Procedures (including critical care time)  Medications Ordered in UC Medications - No data to display  Initial Impression / Assessment and Plan / UC Course  I have reviewed the triage vital signs and the nursing notes.  Pertinent labs & imaging results that were available during my care of the patient were reviewed by me and considered in my medical decision making (see chart for details).     47 year old female with persistent vaginal discharge and pelvic cramping despite treatment for BV, yeast and chlamydia.  She has completed the course of Diflucan, metronidazole and doxycycline as prescribed.  She has not been sexually active in  the last week.  Will repeat wet prep to check for BV and yeast, which was negative.  She opted not to wait for these results and would like to be called with any positive result and treatment recommendation.  Unable to check for chlamydia, gonorrhea as it is too soon.  Advised her to follow-up with her PCP in 1 week if symptoms persist or worsen.  Encouraged safe sexual practices.  Avoid bubble baths and douching.  Avoid scented feminine products.  Final Clinical Impressions(s) / UC Diagnoses   Final diagnoses:  Vaginal discharge  Pelvic pressure in female     Discharge Instructions      You were seen today for pelvic pain and persistent vaginal discharge.  It is too soon to repeat your STD testing.  You will need to do this in another week or so at your primary care office.  We are testing you today for BV and yeast.  We will call you with any positive results.  If you do not receive a call it is because your test were negative.   ED Prescriptions   None    PDMP not reviewed this encounter.   Lorre Munroe, NP 04/06/23 1655

## 2023-04-06 NOTE — ED Triage Notes (Signed)
Patient presents with discharge. Patient states she has not had intercourse since testing positive.

## 2023-04-06 NOTE — Discharge Instructions (Signed)
You were seen today for pelvic pain and persistent vaginal discharge.  It is too soon to repeat your STD testing.  You will need to do this in another week or so at your primary care office.  We are testing you today for BV and yeast.  We will call you with any positive results.  If you do not receive a call it is because your test were negative.

## 2023-06-30 ENCOUNTER — Ambulatory Visit
Admission: EM | Admit: 2023-06-30 | Discharge: 2023-06-30 | Disposition: A | Discharge status: Home / Self Care | Payer: Medicaid Other | Attending: Emergency Medicine | Admitting: Emergency Medicine

## 2023-06-30 ENCOUNTER — Encounter: Payer: Self-pay | Admitting: Emergency Medicine

## 2023-06-30 DIAGNOSIS — B9689 Other specified bacterial agents as the cause of diseases classified elsewhere: Secondary | ICD-10-CM

## 2023-06-30 DIAGNOSIS — R195 Other fecal abnormalities: Secondary | ICD-10-CM

## 2023-06-30 DIAGNOSIS — N760 Acute vaginitis: Secondary | ICD-10-CM | POA: Diagnosis present

## 2023-06-30 LAB — URINALYSIS, W/ REFLEX TO CULTURE (INFECTION SUSPECTED)
Bilirubin Urine: NEGATIVE
Glucose, UA: NEGATIVE mg/dL
Ketones, ur: NEGATIVE mg/dL
Leukocytes,Ua: NEGATIVE
Nitrite: NEGATIVE
Protein, ur: NEGATIVE mg/dL
Specific Gravity, Urine: >1.03 — ABNORMAL HIGH (ref 1.005–1.030)
pH: 5.5 (ref 5.0–8.0)

## 2023-06-30 LAB — WET PREP, GENITAL
Sperm: NONE SEEN
Trich, Wet Prep: NONE SEEN
WBC, Wet Prep HPF POC: <10 — AB (ref <10)
Yeast Wet Prep HPF POC: NONE SEEN

## 2023-06-30 LAB — OCCULT BLOOD X 1 CARD TO LAB, STOOL: Fecal Occult Bld: POSITIVE — AB

## 2023-06-30 MED ORDER — METRONIDAZOLE 500 MG PO TABS: 500.0000 mg | ORAL_TABLET | Freq: Two times a day (BID) | ORAL | 0 refills | Status: DC

## 2023-06-30 NOTE — Discharge Instructions (Signed)
Take the Flagyl (metronidazole) 500 mg twice daily for treatment of your bacterial vaginosis.  Avoid alcohol while on the metronidazole as taken together will cause of vomiting.  Bacterial vaginosis is often caused by a imbalance of bacteria in your vaginal vault.  This is sometimes a result of using tampons or hormonal fluctuations during her menstrual cycle.  You if your symptoms are recurrent you can try using a boric acid suppository twice weekly to help maintain the acid-base balance in your vagina vault which could prevent further infection.  You can also try vaginal probiotics to help return normal bacterial balance.   The stool exam did show a trace amount of microscopic blood present in your stool.  This is most likely related to gut inflammation as a result of the diarrhea you are experiencing from taking metformin.  Eliminating carbohydrates from your diet should help cut down or eliminate the diarrhea.  Continue to monitor for any increase in rectal bleeding.  If you develop any frank bleeding from your rectum, sharp constant abdominal pain, or fevers you need to go to the ER for evaluation.  Your testing for STIs will be back in the next 1 to 2 days.  If you test positive for any infection you will be contacted by phone and treatment options will be provided.  If your results are negative they will appear in your MyChart.

## 2023-06-30 NOTE — ED Triage Notes (Signed)
Patient reports dysuria and urinary frequency that started a month ago.  Patient also reports vaginal odor and discharge.  Patient also noticed some blood in her stools after using the restroom this morning.

## 2023-06-30 NOTE — ED Provider Notes (Signed)
[facility]    CSN: [id number] Arrival date & time: 06/30/23  1332      History   Chief Complaint Chief Complaint  Patient presents with   Vaginal Discharge   Urinary Frequency    HPI Erica Pierce is a 47 y.o. female.   HPI  47 year old female with a past medical history significant for asthma, gestational diabetes, hypertension, PCOS, and chronic back pain presents for evaluation of gastrointestinal and genitourinary complaints.  She is reporting that she has had dysuria and urinary frequency for the past month but denies any fever or blood in her urine.  For the past 2 weeks she has been experiencing a vaginal discharge and vaginal odor.  This is also associated with some nausea.  This morning she noticed blood when she wiped after having a bowel movement.  She reports that she has been experiencing diarrhea as she started taking metformin for diabetes.  She denies any rectal itching or pain.  She also denies any abdominal pain or vomiting.  Patient does have a concern for sexually transmitted infections as she and her husband have been separated but she recently had intercourse with him and then her vaginal symptoms develop.  Past Medical History:  Diagnosis Date   Asthma    Chlamydia 1999   Chronic back pain    Gestational diabetes    Hypertension    PCOS (polycystic ovarian syndrome)    Sciatica, left side     Patient Active Problem List   Diagnosis Date Noted   Decreased fetal movement affecting management of pregnancy in third trimester 03/24/2020    Past Surgical History:  Procedure Laterality Date   CESAREAN SECTION     CHOLECYSTECTOMY      OB History     Gravida  1   Para      Term      Preterm      AB      Living         SAB      IAB      Ectopic      Multiple      Live Births               Home Medications    Prior to Admission medications   Medication Sig Start Date End Date Taking? Authorizing Provider   metroNIDAZOLE (FLAGYL) 500 MG tablet Take 1 tablet (500 mg total) by mouth 2 (two) times daily. 06/30/23  Yes Becky Augusta, NP  albuterol (VENTOLIN HFA) 108 (90 Base) MCG/ACT inhaler Inhale 2 puffs into the lungs every 4 (four) hours as needed for wheezing or shortness of breath. 11/01/22   Rodriguez-Southworth, Nettie Elm, PA-C  baclofen (LIORESAL) 10 MG tablet Take 1 tablet (10 mg total) by mouth 3 (three) times daily. 12/30/22   Becky Augusta, NP  benzonatate (TESSALON) 200 MG capsule Take 1 capsule (200 mg total) by mouth 3 (three) times daily as needed for cough. 11/01/22   Rodriguez-Southworth, Nettie Elm, PA-C  cyclobenzaprine (FLEXERIL) 10 MG tablet Take 1 tablet (10 mg total) by mouth 3 (three) times daily as needed for muscle spasms. 04/21/22   Shirlee Latch, PA-C  fexofenadine (ALLEGRA ALLERGY) 180 MG tablet Take 1 tablet (180 mg total) by mouth daily. 11/01/22   Rodriguez-Southworth, Nettie Elm, PA-C  ipratropium (ATROVENT) 0.06 % nasal spray Place 2 sprays into both nostrils 4 (four) times daily. 06/29/22   Eusebio Friendly B, PA-C  lidocaine (XYLOCAINE) 2 % solution Apply with q tip to  gum sore q 6h prn pain 05/11/22   Rodriguez-Southworth, Nettie Elm, PA-C  meloxicam (MOBIC) 15 MG tablet Take 1 tablet (15 mg total) by mouth daily. 05/11/22   Rodriguez-Southworth, Nettie Elm, PA-C  metFORMIN (GLUCOPHAGE) 500 MG tablet Take by mouth 2 (two) times daily with a meal.    [provider]  naproxen (NAPROSYN) 500 MG tablet Take 1 tablet (500 mg total) by mouth 2 (two) times daily as needed for moderate pain. 04/21/22   Eusebio Friendly B, PA-C  NIFEdipine (PROCARDIA-XL/NIFEDICAL-XL) 30 MG 24 hr tablet Take 30 mg by mouth daily. 11/24/21   [provider]  olopatadine (PATADAY) 0.1 % ophthalmic solution Place 1 drop into both eyes 2 (two) times daily. 05/24/22   Katha Cabal, DO  oseltamivir (TAMIFLU) 75 MG capsule Take 1 capsule (75 mg total) by mouth every 12 (twelve) hours. 06/27/22   Merrilee Jansky,  MD  predniSONE (DELTASONE) 50 MG tablet Take every morning with food for 5 days 12/09/22   Valinda Hoar, NP  trimethoprim-polymyxin b (POLYTRIM) ophthalmic solution Place 2 drops into the right eye 3 (three) times daily. For 7 days 05/25/22   Rodriguez-Southworth, Nettie Elm, PA-C    Family History Family History  Problem Relation Age of Onset   Heart failure Mother    Cancer Paternal Grandmother    Cancer Paternal Grandfather     Social History Social History   Tobacco Use   Smoking status: Former    Types: Cigarettes   Smokeless tobacco: Never  Vaping Use   Vaping status: Never Used  Substance Use Topics   Alcohol use: No   Drug use: Never     Allergies   Elemental sulfur, Sulfa antibiotics, and Shellfish allergy   Review of Systems Review of Systems  Constitutional:  Negative for fever.  Gastrointestinal:  Positive for abdominal pain, blood in stool, diarrhea and nausea. Negative for vomiting.  Genitourinary:  Positive for dysuria, frequency, urgency, vaginal discharge and vaginal pain. Negative for hematuria.  Musculoskeletal:  Negative for back pain.     Physical Exam Triage Vital Signs ED Triage Vitals  Encounter Vitals Group     BP 06/30/23 1346 (!) 141/86     Systolic BP Percentile --      Diastolic BP Percentile --      Pulse Rate 06/30/23 1346 65     Resp 06/30/23 1346 14     Temp 06/30/23 1346 97.8 F (36.6 C)     Temp Source 06/30/23 1346 Oral     SpO2 06/30/23 1346 98 %     Weight 06/30/23 1345 242 lb 15.2 oz (110.2 kg)     Height 06/30/23 1345 5\' 6"  (1.676 m)     Head Circumference --      Peak Flow --      Pain Score 06/30/23 1345 7     Pain Loc --      Pain Education --      Exclude from Growth Chart --    No data found.  Updated Vital Signs BP (!) 141/86 (BP Location: Left Arm)   Pulse 65   Temp 97.8 F (36.6 C) (Oral)   Resp 14   Ht 5\' 6"  (1.676 m)   Wt 242 lb 15.2 oz (110.2 kg)   LMP 06/19/2023 (Approximate)   SpO2 98%   BMI  39.21 kg/m   Visual Acuity Right Eye Distance:   Left Eye Distance:   Bilateral Distance:    Right Eye Near:   Left  Eye Near:    Bilateral Near:     Physical Exam Vitals and nursing note reviewed. Exam conducted with a chaperone present (Dr. Rachael Darby).  Constitutional:      Appearance: Normal appearance. She is not ill-appearing.  HENT:     Head: Normocephalic and atraumatic.  Cardiovascular:     Rate and Rhythm: Normal rate and regular rhythm.     Pulses: Normal pulses.     Heart sounds: Normal heart sounds. No murmur heard.    No friction rub. No gallop.  Pulmonary:     Effort: Pulmonary effort is normal.     Breath sounds: Normal breath sounds. No wheezing, rhonchi or rales.  Abdominal:     General: Abdomen is flat.     Palpations: Abdomen is soft.     Tenderness: There is abdominal tenderness. There is no right CVA tenderness, left CVA tenderness, guarding or rebound.     Comments: Abdominal tenderness without focal finding.  No guarding or rebound.  Genitourinary:    Rectum: Normal.  Skin:    General: Skin is warm and dry.  Neurological:     Mental Status: She is alert.      UC Treatments / Results  Labs (all labs ordered are listed, but only abnormal results are displayed) Labs Reviewed  WET PREP, GENITAL - Abnormal; Notable for the following components:      Result Value   Clue Cells Wet Prep HPF POC PRESENT (*)    WBC, Wet Prep HPF POC <10 (*)    All other components within normal limits  URINALYSIS, W/ REFLEX TO CULTURE (INFECTION SUSPECTED) - Abnormal; Notable for the following components:   APPearance HAZY (*)    Specific Gravity, Urine >1.030 (*)    Hgb urine dipstick TRACE (*)    Bacteria, UA FEW (*)    All other components within normal limits  OCCULT BLOOD X 1 CARD TO LAB, STOOL - Abnormal; Notable for the following components:   Fecal Occult Bld POSITIVE (*)    All other components within normal limits  CERVICOVAGINAL ANCILLARY ONLY     EKG   Radiology No results found.  Procedures Procedures (including critical care time)  Medications Ordered in UC Medications - No data to display  Initial Impression / Assessment and Plan / UC Course  I have reviewed the triage vital signs and the nursing notes.  Pertinent labs & imaging results that were available during my care of the patient were reviewed by me and considered in my medical decision making (see chart for details).   Patient is a pleasant, nontoxic-appearing 61 old female presenting for evaluation of gastrointestinal and genitourinary complaints as outlined HPI above.  Patient reports that this morning for the first time she noticed blood after she had a bowel movement.  She noticed it when she wiped the first time but it was not that the second time.  No history of hemorrhoids or rectal itching.  With Dr. Selinda Orion just my chaperone I performed a visual inspection of the patient's perineum which did not reveal any external hemorrhoids.  DRE did not reveal any tenderness and there is no gross blood.  Stool guaiac was sent to the lab.  Remainder of her physical exam reveals a protuberant abdomen but it is soft with generalized tenderness.  No guarding or rebound and no focal findings.  I will order urinalysis to evaluate for the presence of UTI along with a vaginal wet prep to evaluate for the presence of  bacterial vaginosis or vaginal yeast infection which could be explained planing the patient's vaginal itching and malodorous discharge.  I will also order a cervical vaginal cytology swab to evaluate for the presence of gonorrhea and chlamydia.  I suspect that the blood she noticed after having a bowel movement is secondary to the diarrhea that she has been experiencing as a result of starting metformin.  Vaginal wet prep is positive for clue cells.  Stool guaiac is positive.  Urinalysis shows hazy appearance with high specific gravity and trace hemoglobin but is  negative for leukocyte esterase, nitrates, or protein.  Reflex microscopy shows 6-10 squamous epithelials but no white and red cells.  I will discharge patient home with a diagnosis of bacterial vaginosis and started on metronidazole twice daily for 7 days.  The microscopic blood positive on the guaiac card is most likely secondary to GI inflammation and due to the diarrhea that she is experiencing from the metformin.  I will have her continue to monitor and if she develops any abdominal pain, frank rectal bleeding, or fever she should go to the ER for evaluation.   Final Clinical Impressions(s) / UC Diagnoses   Final diagnoses:  BV (bacterial vaginosis)  Stool guaiac positive     Discharge Instructions      Take the Flagyl (metronidazole) 500 mg twice daily for treatment of your bacterial vaginosis.  Avoid alcohol while on the metronidazole as taken together will cause of vomiting.  Bacterial vaginosis is often caused by a imbalance of bacteria in your vaginal vault.  This is sometimes a result of using tampons or hormonal fluctuations during her menstrual cycle.  You if your symptoms are recurrent you can try using a boric acid suppository twice weekly to help maintain the acid-base balance in your vagina vault which could prevent further infection.  You can also try vaginal probiotics to help return normal bacterial balance.   The stool exam did show a trace amount of microscopic blood present in your stool.  This is most likely related to gut inflammation as a result of the diarrhea you are experiencing from taking metformin.  Eliminating carbohydrates from your diet should help cut down or eliminate the diarrhea.  Continue to monitor for any increase in rectal bleeding.  If you develop any frank bleeding from your rectum, sharp constant abdominal pain, or fevers you need to go to the ER for evaluation.  Your testing for STIs will be back in the next 1 to 2 days.  If you test  positive for any infection you will be contacted by phone and treatment options will be provided.  If your results are negative they will appear in your MyChart.     ED Prescriptions     Medication Sig Dispense Auth. Provider   metroNIDAZOLE (FLAGYL) 500 MG tablet Take 1 tablet (500 mg total) by mouth 2 (two) times daily. 14 tablet Becky Augusta, NP      PDMP not reviewed this encounter.   Becky Augusta, NP 06/30/23 1428

## 2023-07-03 LAB — CERVICOVAGINAL ANCILLARY ONLY
Chlamydia: NEGATIVE
Comment: NEGATIVE
Comment: NORMAL
Neisseria Gonorrhea: NEGATIVE

## 2023-07-19 ENCOUNTER — Ambulatory Visit
Admission: EM | Admit: 2023-07-19 | Discharge: 2023-07-19 | Disposition: A | Discharge status: Home / Self Care | Payer: Medicaid Other

## 2023-07-19 DIAGNOSIS — R112 Nausea with vomiting, unspecified: Secondary | ICD-10-CM | POA: Diagnosis not present

## 2023-07-19 DIAGNOSIS — R197 Diarrhea, unspecified: Secondary | ICD-10-CM

## 2023-07-19 MED ORDER — ONDANSETRON HCL 4 MG PO TABS: 4.0000 mg | ORAL_TABLET | Freq: Four times a day (QID) | ORAL | 0 refills | Status: DC

## 2023-07-19 MED ORDER — ONDANSETRON 4 MG PO TBDP
4.0000 mg | ORAL_TABLET | Freq: Once | ORAL | Status: AC
  Administered 2023-07-19: 4 mg via ORAL

## 2023-07-19 NOTE — ED Provider Notes (Signed)
 [facility]    CSN: [id number] Arrival date & time: 07/19/23  0947      History   Chief Complaint Chief Complaint  Patient presents with   Emesis   Nausea    HPI Erica Pierce is a 48 y.o. female.   HPI  48 year old female with a past medical history significant for PCOS, hypertension, chlamydia, asthma, and recurrent BV presents for ongoing nausea and diarrhea with vomiting that started today.  She has had 2 episodes of vomiting today.  Past Medical History:  Diagnosis Date   Asthma    Chlamydia 1999   Chronic back pain    Gestational diabetes    Hypertension    PCOS (polycystic ovarian syndrome)    Sciatica, left side     Patient Active Problem List   Diagnosis Date Noted   Decreased fetal movement affecting management of pregnancy in third trimester 03/24/2020    Past Surgical History:  Procedure Laterality Date   CESAREAN SECTION     CHOLECYSTECTOMY      OB History     Gravida  1   Para      Term      Preterm      AB      Living         SAB      IAB      Ectopic      Multiple      Live Births               Home Medications    Prior to Admission medications   Medication Sig Start Date End Date Taking? Authorizing Provider  atorvastatin (LIPITOR) 20 MG tablet Take 20 mg by mouth daily.   Yes [provider]  metFORMIN (GLUCOPHAGE) 500 MG tablet Take by mouth 2 (two) times daily with a meal.   Yes [provider]  ondansetron  (ZOFRAN ) 4 MG tablet Take 1 tablet (4 mg total) by mouth every 6 (six) hours. 07/19/23  Yes Bernardino Ditch, NP  VICTOZA 18 MG/3ML SOPN SMARTSIG:0.6 Milligram(s) SUB-Q Daily   Yes [provider]  albuterol  (VENTOLIN  HFA) 108 (90 Base) MCG/ACT inhaler Inhale 2 puffs into the lungs every 4 (four) hours as needed for wheezing or shortness of breath. 11/01/22   Rodriguez-Southworth, Sylvia, PA-C  baclofen  (LIORESAL ) 10 MG tablet Take 1 tablet (10 mg total) by mouth 3 (three)  times daily. 12/30/22   Bernardino Ditch, NP  benzonatate  (TESSALON ) 200 MG capsule Take 1 capsule (200 mg total) by mouth 3 (three) times daily as needed for cough. 11/01/22   Rodriguez-Southworth, Sylvia, PA-C  cyclobenzaprine  (FLEXERIL ) 10 MG tablet Take 1 tablet (10 mg total) by mouth 3 (three) times daily as needed for muscle spasms. 04/21/22   Arvis Jolan NOVAK, PA-C  fexofenadine  (ALLEGRA  ALLERGY) 180 MG tablet Take 1 tablet (180 mg total) by mouth daily. 11/01/22   Rodriguez-Southworth, Sylvia, PA-C  ipratropium (ATROVENT ) 0.06 % nasal spray Place 2 sprays into both nostrils 4 (four) times daily. 06/29/22   Arvis Jolan NOVAK, PA-C  lidocaine  (XYLOCAINE ) 2 % solution Apply with q tip to gum sore q 6h prn pain 05/11/22   Rodriguez-Southworth, Sylvia, PA-C  meloxicam  (MOBIC ) 15 MG tablet Take 1 tablet (15 mg total) by mouth daily. 05/11/22   Rodriguez-Southworth, Sylvia, PA-C  metroNIDAZOLE  (FLAGYL ) 500 MG tablet Take 1 tablet (500 mg total) by mouth 2 (two) times daily. 06/30/23   Bernardino Ditch, NP  naproxen  (NAPROSYN ) 500 MG tablet Take  1 tablet (500 mg total) by mouth 2 (two) times daily as needed for moderate pain. 04/21/22   Arvis Huxley B, PA-C  NIFEdipine (PROCARDIA-XL/NIFEDICAL-XL) 30 MG 24 hr tablet Take 30 mg by mouth daily. 11/24/21   [provider]  olopatadine  (PATADAY ) 0.1 % ophthalmic solution Place 1 drop into both eyes 2 (two) times daily. 05/24/22   Brimage, Vondra, DO  oseltamivir  (TAMIFLU ) 75 MG capsule Take 1 capsule (75 mg total) by mouth every 12 (twelve) hours. 06/27/22   Blaise Aleene KIDD, MD  predniSONE  (DELTASONE ) 50 MG tablet Take every morning with food for 5 days 12/09/22   Teresa Shelba SAUNDERS, NP  trimethoprim -polymyxin b  (POLYTRIM ) ophthalmic solution Place 2 drops into the right eye 3 (three) times daily. For 7 days 05/25/22   Rodriguez-Southworth, Kyra, PA-C    Family History Family History  Problem Relation Age of Onset   Heart failure Mother    Cancer Paternal  Grandmother    Cancer Paternal Grandfather     Social History Social History   Tobacco Use   Smoking status: Former    Types: Cigarettes   Smokeless tobacco: Never  Vaping Use   Vaping status: Never Used  Substance Use Topics   Alcohol use: No   Drug use: Never     Allergies   Elemental sulfur, Sulfa antibiotics, and Shellfish allergy   Review of Systems Review of Systems  Gastrointestinal:  Positive for diarrhea, nausea and vomiting. Negative for abdominal pain.     Physical Exam Triage Vital Signs ED Triage Vitals  Encounter Vitals Group     BP      Systolic BP Percentile      Diastolic BP Percentile      Pulse      Resp      Temp      Temp src      SpO2      Weight      Height      Head Circumference      Peak Flow      Pain Score      Pain Loc      Pain Education      Exclude from Growth Chart    No data found.  Updated Vital Signs BP 137/84 (BP Location: Right Arm)   Pulse 80   Temp 98.6 F (37 C) (Oral)   Resp 19   LMP 07/12/2023 (Exact Date)   SpO2 98%   Visual Acuity Right Eye Distance:   Left Eye Distance:   Bilateral Distance:    Right Eye Near:   Left Eye Near:    Bilateral Near:     Physical Exam Vitals and nursing note reviewed.  Constitutional:      Appearance: Normal appearance. She is not ill-appearing.  HENT:     Head: Normocephalic and atraumatic.  Cardiovascular:     Rate and Rhythm: Normal rate and regular rhythm.     Pulses: Normal pulses.     Heart sounds: Normal heart sounds. No murmur heard.    No friction rub. No gallop.  Pulmonary:     Effort: Pulmonary effort is normal.     Breath sounds: Normal breath sounds. No wheezing, rhonchi or rales.  Skin:    General: Skin is warm and dry.     Capillary Refill: Capillary refill takes less than 2 seconds.     Findings: No rash.  Neurological:     General: No focal deficit present.     Mental  Status: She is alert and oriented to person, place, and time.       UC Treatments / Results  Labs (all labs ordered are listed, but only abnormal results are displayed) Labs Reviewed - No data to display  EKG   Radiology No results found.  Procedures Procedures (including critical care time)  Medications Ordered in UC Medications  ondansetron  (ZOFRAN -ODT) disintegrating tablet 4 mg (has no administration in time range)    Initial Impression / Assessment and Plan / UC Course  I have reviewed the triage vital signs and the nursing notes.  Pertinent labs & imaging results that were available during my care of the patient were reviewed by me and considered in my medical decision making (see chart for details).   Patient is a nontoxic-appearing 48 year old female presenting for evaluation of nausea, vomiting, and diarrhea.  This is not a new symptom.  She is taking metformin for her diabetes and reports that she is taking 500 mg twice daily.  She is also on Victoza 0.6 mg daily for control of her diabetes.  Average home glucose is at 90-140.  She reports that she is following the plate method and filling up half of her plate with nonstarchy vegetables, one quarter lean protein, and one quarter starchy carbohydrates.  She was seen in this urgent care on 06/30/2023 for similar symptoms and at that time she was having blood when she wiped.  Her stool guaiac was positive.  No melena.  She then went to the ER that same day but left before being seen.  She was in the ER again 2 days ago for headache and had blood work performed at that time which showed an H&H of 10.9 and 34.8.  Largely unchanged from what patient's blood work has been showing over the last year.  I suspect most likely that her GI symptoms are secondary to her metformin and I have advised her to make an appointment with her PCP to discuss possibly being switched to a extended release version of metformin.  In the meantime I will have staff administer a dose of p.o. Zofran  and will discharge  patient home with a prescription for Zofran  that she can use every 6 hours as needed for nausea or vomiting.  ER and return precautions reviewed.  Work note provided.   Final Clinical Impressions(s) / UC Diagnoses   Final diagnoses:  Nausea vomiting and diarrhea     Discharge Instructions      As we discussed, your GI symptoms are most likely secondary to your metformin.  You need to schedule an appointment with your primary care provider to discuss possibly being transitioned to an extended release form to minimize your GI symptoms.  Use the Zofran  every 6 hours as needed for nausea and vomiting.  Continue to follow the plate method and minimize your starchy food and carbohydrate intake.  Continue to orally rehydrate with water, sugar-free Gatorade, or liquid IV.  If you develop any sharp abdominal pain, vomiting where you cannot keep down fluids and is not responding to the Zofran , or fevers you need to go to the ER for evaluation.     ED Prescriptions     Medication Sig Dispense Auth. Provider   ondansetron  (ZOFRAN ) 4 MG tablet Take 1 tablet (4 mg total) by mouth every 6 (six) hours. 12 tablet Bernardino Ditch, NP      PDMP not reviewed this encounter.   Bernardino Ditch, NP 07/19/23 1038

## 2023-07-19 NOTE — Discharge Instructions (Signed)
 As we discussed, your GI symptoms are most likely secondary to your metformin.  You need to schedule an appointment with your primary care provider to discuss possibly being transitioned to an extended release form to minimize your GI symptoms.  Use the Zofran  every 6 hours as needed for nausea and vomiting.  Continue to follow the plate method and minimize your starchy food and carbohydrate intake.  Continue to orally rehydrate with water, sugar-free Gatorade, or liquid IV.  If you develop any sharp abdominal pain, vomiting where you cannot keep down fluids and is not responding to the Zofran , or fevers you need to go to the ER for evaluation.

## 2023-07-19 NOTE — ED Triage Notes (Signed)
 Nausea for about 3 week.  Vomiting stated today LMP 07/12/23 Patient started back metformin 2 weeks ago.

## 2023-07-31 ENCOUNTER — Ambulatory Visit
Admission: EM | Admit: 2023-07-31 | Discharge: 2023-07-31 | Disposition: A | Discharge status: Home / Self Care | Payer: Medicaid Other | Attending: Physician Assistant | Admitting: Physician Assistant

## 2023-07-31 DIAGNOSIS — B9689 Other specified bacterial agents as the cause of diseases classified elsewhere: Secondary | ICD-10-CM | POA: Insufficient documentation

## 2023-07-31 DIAGNOSIS — Z113 Encounter for screening for infections with a predominantly sexual mode of transmission: Secondary | ICD-10-CM | POA: Diagnosis not present

## 2023-07-31 DIAGNOSIS — N898 Other specified noninflammatory disorders of vagina: Secondary | ICD-10-CM | POA: Diagnosis present

## 2023-07-31 DIAGNOSIS — R350 Frequency of micturition: Secondary | ICD-10-CM | POA: Insufficient documentation

## 2023-07-31 DIAGNOSIS — N760 Acute vaginitis: Secondary | ICD-10-CM | POA: Insufficient documentation

## 2023-07-31 DIAGNOSIS — I10 Essential (primary) hypertension: Secondary | ICD-10-CM | POA: Insufficient documentation

## 2023-07-31 LAB — WET PREP, GENITAL
Sperm: NONE SEEN
Trich, Wet Prep: NONE SEEN
WBC, Wet Prep HPF POC: <10 — AB (ref <10)
Yeast Wet Prep HPF POC: NONE SEEN

## 2023-07-31 LAB — URINALYSIS, W/ REFLEX TO CULTURE (INFECTION SUSPECTED)
Bilirubin Urine: NEGATIVE
Glucose, UA: NEGATIVE mg/dL
Ketones, ur: NEGATIVE mg/dL
Leukocytes,Ua: NEGATIVE
Nitrite: NEGATIVE
Protein, ur: NEGATIVE mg/dL
Specific Gravity, Urine: 1.025 (ref 1.005–1.030)
pH: 6 (ref 5.0–8.0)

## 2023-07-31 MED ORDER — METRONIDAZOLE 500 MG PO TABS: 500.0000 mg | ORAL_TABLET | Freq: Two times a day (BID) | ORAL | 0 refills | Status: AC

## 2023-07-31 NOTE — ED Provider Notes (Signed)
 [facility]    CSN: [id number] Arrival date & time: 07/31/23  1616      History   Chief Complaint Chief Complaint  Patient presents with   Vaginal Discharge   Vaginal Itching   Urinary Frequency    HPI Erica Pierce is a 48 y.o. female presenting for 2-week history of thin foul-smelling vaginal discharge, itching and urinary frequency.  States she has not been sexually active in a while but would like to have STD screening.  Patient is concerned about her elevated blood pressure reading today which is 158/90.  A recheck is 143/83.  She was previously on antihypertensive medications but states they took her off because her blood pressure was okay.  She does report a history of diabetes.  HPI  Past Medical History:  Diagnosis Date   Asthma    Chlamydia 1999   Chronic back pain    Gestational diabetes    Hypertension    PCOS (polycystic ovarian syndrome)    Sciatica, left side     Patient Active Problem List   Diagnosis Date Noted   Decreased fetal movement affecting management of pregnancy in third trimester 03/24/2020    Past Surgical History:  Procedure Laterality Date   CESAREAN SECTION     CHOLECYSTECTOMY      OB History     Gravida  1   Para      Term      Preterm      AB      Living         SAB      IAB      Ectopic      Multiple      Live Births               Home Medications    Prior to Admission medications   Medication Sig Start Date End Date Taking? Authorizing Provider  metroNIDAZOLE  (FLAGYL ) 500 MG tablet Take 1 tablet (500 mg total) by mouth 2 (two) times daily for 7 days. 07/31/23 08/07/23 Yes Arvis Jolan NOVAK, PA-C  albuterol  (VENTOLIN  HFA) 108 (90 Base) MCG/ACT inhaler Inhale 2 puffs into the lungs every 4 (four) hours as needed for wheezing or shortness of breath. 11/01/22   Rodriguez-Southworth, Sylvia, PA-C  atorvastatin (LIPITOR) 20 MG tablet Take 20 mg by mouth daily.    [provider]   metFORMIN (GLUCOPHAGE) 500 MG tablet Take by mouth 2 (two) times daily with a meal.    [provider]  olopatadine  (PATADAY ) 0.1 % ophthalmic solution Place 1 drop into both eyes 2 (two) times daily. 05/24/22   Brimage, Vondra, DO  VICTOZA 18 MG/3ML SOPN SMARTSIG:0.6 Milligram(s) SUB-Q Daily    [provider]    Family History Family History  Problem Relation Age of Onset   Heart failure Mother    Cancer Paternal Grandmother    Cancer Paternal Grandfather     Social History Social History   Tobacco Use   Smoking status: Former    Types: Cigarettes   Smokeless tobacco: Never  Vaping Use   Vaping status: Never Used  Substance Use Topics   Alcohol use: No   Drug use: Never     Allergies   Elemental sulfur, Sulfa antibiotics, and Shellfish allergy   Review of Systems Review of Systems  Constitutional:  Negative for fatigue and fever.  Gastrointestinal:  Negative for abdominal pain.  Genitourinary:  Positive for frequency and vaginal discharge. Negative for dysuria, flank pain,  hematuria, urgency, vaginal bleeding and vaginal pain.  Musculoskeletal:  Negative for back pain.  Skin:  Negative for rash.     Physical Exam Triage Vital Signs ED Triage Vitals [07/31/23 1733]  Encounter Vitals Group     BP (!) 158/90     Systolic BP Percentile      Diastolic BP Percentile      Pulse Rate 78     Resp 18     Temp 97.8 F (36.6 C)     Temp Source Oral     SpO2 98 %     Weight      Height      Head Circumference      Peak Flow      Pain Score      Pain Loc      Pain Education      Exclude from Growth Chart    No data found.  Updated Vital Signs BP (!) 143/83 (BP Location: Left Arm)   Pulse 78   Temp 97.8 F (36.6 C) (Oral)   Resp 18   LMP 07/12/2023 (Exact Date)   SpO2 98%    Physical Exam Vitals and nursing note reviewed.  Constitutional:      General: She is not in acute distress.    Appearance: Normal appearance. She is not  ill-appearing or toxic-appearing.  HENT:     Head: Normocephalic and atraumatic.  Eyes:     General: No scleral icterus.       Right eye: No discharge.        Left eye: No discharge.     Conjunctiva/sclera: Conjunctivae normal.  Cardiovascular:     Rate and Rhythm: Normal rate and regular rhythm.     Heart sounds: Normal heart sounds.  Pulmonary:     Effort: Pulmonary effort is normal. No respiratory distress.     Breath sounds: Normal breath sounds.  Abdominal:     Palpations: Abdomen is soft.     Tenderness: There is no abdominal tenderness. There is no right CVA tenderness or left CVA tenderness.  Musculoskeletal:     Cervical back: Neck supple.  Skin:    General: Skin is dry.  Neurological:     General: No focal deficit present.     Mental Status: She is alert. Mental status is at baseline.     Motor: No weakness.     Gait: Gait normal.  Psychiatric:        Mood and Affect: Mood normal.        Behavior: Behavior normal.      UC Treatments / Results  Labs (all labs ordered are listed, but only abnormal results are displayed) Labs Reviewed  WET PREP, GENITAL - Abnormal; Notable for the following components:      Result Value   Clue Cells Wet Prep HPF POC PRESENT (*)    WBC, Wet Prep HPF POC <10 (*)    All other components within normal limits  URINALYSIS, W/ REFLEX TO CULTURE (INFECTION SUSPECTED) - Abnormal; Notable for the following components:   Hgb urine dipstick TRACE (*)    Bacteria, UA FEW (*)    All other components within normal limits  CERVICOVAGINAL ANCILLARY ONLY    EKG   Radiology No results found.  Procedures Procedures (including critical care time)  Medications Ordered in UC Medications - No data to display  Initial Impression / Assessment and Plan / UC Course  I have reviewed the triage vital signs and the nursing  notes.  Pertinent labs & imaging results that were available during my care of the patient were reviewed by me and  considered in my medical decision making (see chart for details).   47-year female presents for foul-smelling vaginal discharge, itching and urinary frequency over the past couple weeks.  Also request STI screening.  Additionally, reports concerns about elevated blood pressure reading of 158/90.  Recheck 143/83.  History of hypertension but no longer on medications.  Not reporting any chest pain, palpitations, dizziness, headaches or weakness.  Patient elects to perform vaginal self swab for GC/chlamydia and wet prep.  Urinalysis also obtained.  UA not consistent with UTI.  Wet prep shows clue cells.  Will treat for BV with metronidazole .  Pending GC/chlamydia testing.  Will treat according to results.  Discussed keeping a log of blood pressure readings and advised if blood pressures consistently over 130/80 she may need to discuss getting back on antihypertensive medications.  Acute illness and chronic condition.  Final Clinical Impressions(s) / UC Diagnoses   Final diagnoses:  BV (bacterial vaginosis)  Vaginal discharge  Urinary frequency  Screening examination for STD (sexually transmitted disease)  Essential hypertension     Discharge Instructions      The most common types of vaginal infections are yeast infections and bacterial vaginosis. Neither of which are really considered to be sexually transmitted. Often a pH swab or wet prep is performed and if abnormal may reveal either type of infection. Begin metronidazole  if prescribed for possible BV infection. If there is concern for yeast infection, fluconazole  is often prescribed . Take this as directed. You may also apply topical miconazole (can be purchased OTC) externally for relief of itching. Increase rest and fluid intake. If labs sent out, we will call within 2-5 days with results and amend treatment if necessary. Always try to use pH balanced washes/wipes, urinate after intercourse, stay hydrated, and take probiotics if you are  prone to vaginal infections. Return or see PCP or gynecologist for new/worsening infections.    -Keep log of BP and if >130/80 frequently discuss hypertension meds with your provider.     ED Prescriptions     Medication Sig Dispense Auth. Provider   metroNIDAZOLE  (FLAGYL ) 500 MG tablet Take 1 tablet (500 mg total) by mouth 2 (two) times daily for 7 days. 14 tablet Asiah Befort B, PA-C      PDMP not reviewed this encounter.   Arvis Jolan NOVAK, PA-C 07/31/23 [phone]

## 2023-07-31 NOTE — Discharge Instructions (Signed)
 The most common types of vaginal infections are yeast infections and bacterial vaginosis. Neither of which are really considered to be sexually transmitted. Often a pH swab or wet prep is performed and if abnormal may reveal either type of infection. Begin metronidazole  if prescribed for possible BV infection. If there is concern for yeast infection, fluconazole  is often prescribed . Take this as directed. You may also apply topical miconazole (can be purchased OTC) externally for relief of itching. Increase rest and fluid intake. If labs sent out, we will call within 2-5 days with results and amend treatment if necessary. Always try to use pH balanced washes/wipes, urinate after intercourse, stay hydrated, and take probiotics if you are prone to vaginal infections. Return or see PCP or gynecologist for new/worsening infections.    -Keep log of BP and if >130/80 frequently discuss hypertension meds with your provider.

## 2023-07-31 NOTE — ED Triage Notes (Signed)
 Pt presents with vaginal discharge, urinary frequency and vaginal itching x 2 weeks.

## 2023-08-01 LAB — CERVICOVAGINAL ANCILLARY ONLY
Chlamydia: NEGATIVE
Comment: NEGATIVE
Comment: NORMAL
Neisseria Gonorrhea: NEGATIVE

## 2023-08-16 ENCOUNTER — Ambulatory Visit
Admission: EM | Admit: 2023-08-16 | Discharge: 2023-08-16 | Disposition: A | Discharge status: Home / Self Care | Payer: Medicaid Other | Attending: Family Medicine | Admitting: Family Medicine

## 2023-08-16 DIAGNOSIS — I10 Essential (primary) hypertension: Secondary | ICD-10-CM | POA: Diagnosis not present

## 2023-08-16 DIAGNOSIS — E1169 Type 2 diabetes mellitus with other specified complication: Secondary | ICD-10-CM

## 2023-08-16 LAB — GLUCOSE, CAPILLARY: Glucose-Capillary: 78 mg/dL (ref 70–99)

## 2023-08-16 NOTE — ED Triage Notes (Signed)
Pt presents to UC d/t elevated BP,HA & blurred vision. Highest being 155/110 1 hr ago. Hx of htn.

## 2023-08-16 NOTE — Discharge Instructions (Addendum)
Ask you doctor about Mounjaro instead of Victoza and starting a medication to protect your kidneys.  Keep a log of your blood pressure for your doctor. Take the log with your to your doctors office.

## 2023-08-16 NOTE — ED Provider Notes (Signed)
[facility]    CSN: [id number] Arrival date & time: 08/16/23  1902      History   Chief Complaint Chief Complaint  Patient presents with   Hypertension   Headache   Blurred Vision    HPI Erica Pierce is a 48 y.o. female.   HPI   Erica Pierce presents for high blood pressure. Denies no increases   BP 155/110 and 150/98 by the nurse that was there to see her mom today. Has posterior headache  that radiated 5/10. "I know its there."  She has not been staying hydrated. Has some blurry vision. She wears glasses but they are in the car.       Past Medical History:  Diagnosis Date   Asthma    Chlamydia 1999   Chronic back pain    Gestational diabetes    Hypertension    PCOS (polycystic ovarian syndrome)    Sciatica, left side     Patient Active Problem List   Diagnosis Date Noted   Decreased fetal movement affecting management of pregnancy in third trimester 03/24/2020    Past Surgical History:  Procedure Laterality Date   CESAREAN SECTION     CHOLECYSTECTOMY      OB History     Gravida  1   Para      Term      Preterm      AB      Living         SAB      IAB      Ectopic      Multiple      Live Births               Home Medications    Prior to Admission medications   Medication Sig Start Date End Date Taking? Authorizing Provider  albuterol (VENTOLIN HFA) 108 (90 Base) MCG/ACT inhaler Inhale 2 puffs into the lungs every 4 (four) hours as needed for wheezing or shortness of breath. 11/01/22  Yes Rodriguez-Southworth, Nettie Elm, PA-C  atorvastatin (LIPITOR) 20 MG tablet Take 20 mg by mouth daily.   Yes [provider]  cetirizine (ZYRTEC) 10 MG tablet Take 1 tablet by mouth daily. 03/03/23 03/02/24 Yes [provider]  cyclobenzaprine (FLEXERIL) 10 MG tablet Take 10 mg by mouth 2 (two) times daily as needed.   Yes [provider]  diclofenac (VOLTAREN) 75 MG EC tablet Take 75 mg by mouth in the morning  and 75 mg in the evening. 11/23/22 11/23/23 Yes [provider]  omeprazole (PRILOSEC) 20 MG capsule Take 20 mg by mouth daily.   Yes [provider]  VICTOZA 18 MG/3ML SOPN SMARTSIG:0.6 Milligram(s) SUB-Q Daily   Yes [provider]    Family History Family History  Problem Relation Age of Onset   Heart failure Mother    Cancer Paternal Grandmother    Cancer Paternal Grandfather     Social History Social History   Tobacco Use   Smoking status: Former    Types: Cigarettes   Smokeless tobacco: Never  Vaping Use   Vaping status: Never Used  Substance Use Topics   Alcohol use: No   Drug use: Never     Allergies   Sulfa antibiotics, Elemental sulfur, Metformin, and Shellfish allergy   Review of Systems Review of Systems: negative unless otherwise stated in HPI.      Physical Exam Triage Vital Signs ED Triage Vitals  Encounter Vitals Group     BP 08/16/23  1907 138/85     Systolic BP Percentile --      Diastolic BP Percentile --      Pulse Rate 08/16/23 1907 71     Resp 08/16/23 1907 16     Temp 08/16/23 1907 98.6 F (37 C)     Temp Source 08/16/23 1907 Oral     SpO2 08/16/23 1907 98 %     Weight 08/16/23 1905 240 lb (108.9 kg)     Height 08/16/23 1905 5\' 6"  (1.676 m)     Head Circumference --      Peak Flow --      Pain Score 08/16/23 1909 7     Pain Loc --      Pain Education --      Exclude from Growth Chart --    No data found.  Updated Vital Signs BP 132/88 (BP Location: Left Arm)   Pulse 71   Temp 98.6 F (37 C) (Oral)   Resp 16   Ht 5\' 6"  (1.676 m)   Wt 108.9 kg   LMP 08/09/2023 (Exact Date)   SpO2 98%   BMI 38.74 kg/m   Visual Acuity Right Eye Distance:   Left Eye Distance:   Bilateral Distance:    Right Eye Near:   Left Eye Near:    Bilateral Near:     Physical Exam GEN:     alert, well appearing and no distress    HENT:  mucus membranes moist, oropharyngeal without lesions or erythema,  nares patent, no  nasal discharge  EYES:   pupils equal and reactive, EOM intact NECK:  supple, normal ROM, no lymphadenopathy  RESP:  clear to auscultation bilaterally, no increased work of breathing  CVS:   regular rate and rhythm NEURO:  normal without focal findings,  speech normal, alert and oriented   Skin:   warm and dry,      UC Treatments / Results  Labs (all labs ordered are listed, but only abnormal results are displayed) Labs Reviewed  GLUCOSE, CAPILLARY  CBG MONITORING, ED    EKG  If EKG performed, see my interpretation in the MDM section  Radiology No results found.   Procedures Procedures (including critical care time)  Medications Ordered in UC Medications - No data to display  Initial Impression / Assessment and Plan / UC Course  I have reviewed the triage vital signs and the nursing notes.  Pertinent labs & imaging results that were available during my care of the patient were reviewed by me and considered in my medical decision making (see chart for details).       Patient is a 48 y.o. female  who has HTN, T2DM and obesity who presents for elevated home blood pressure with headache and blurry vision.  Overall patient is well-appearing and afebrile.  Vital signs stable.  She is not hypertensive here and characterizes her headache as "just there." Says her doctor took her off her blood pressure medication as she no longer needed them. She was formerly on Nifedipine.  Per her PCP's note from 07/21/23, Angila is diet controlled. Her BP was 118/78. Defer medications to PCP. Reassurance provided.   She is worried that her blood sugar is high as she doesn't check it.  CBG here was 78. Reassurance provided.   ED and return precautions given and patient/guardian voiced understanding. Discussed MDM, treatment plan and plan for follow-up with patient who agrees with plan.     Final Clinical Impressions(s) / UC Diagnoses  Final diagnoses:  Elevated blood pressure reading with  diagnosis of hypertension  Type 2 diabetes mellitus with other specified complication, without long-term current use of insulin [facility])     Discharge Instructions      Ask you doctor about Mounjaro instead of Victoza and starting a medication to protect your kidneys.  Keep a log of your blood pressure for your doctor. Take the log with your to your doctors office.      ED Prescriptions   None    PDMP not reviewed this encounter.   Katha Cabal, DO 08/16/23 2031

## 2023-09-04 ENCOUNTER — Encounter: Payer: Self-pay | Admitting: *Deleted

## 2023-09-04 ENCOUNTER — Ambulatory Visit
Admission: EM | Admit: 2023-09-04 | Discharge: 2023-09-04 | Payer: Medicaid Other | Attending: Family Medicine | Admitting: Family Medicine

## 2023-09-04 DIAGNOSIS — R079 Chest pain, unspecified: Secondary | ICD-10-CM | POA: Diagnosis not present

## 2023-09-04 DIAGNOSIS — R6884 Jaw pain: Secondary | ICD-10-CM

## 2023-09-04 NOTE — Discharge Instructions (Signed)
Given your chest and jaw pain with your risk factors, you were recommended to go to the hospital for further heart evaluation.   You have been advised to follow up immediately in the emergency department for concerning signs or symptoms as discussed during your visit. If you declined EMS transport, please have a family member take you directly to the ED at this time. Do not delay.   Based on concerns about condition, if you do not follow up in the ED, you may risk poor outcomes including worsening of condition, delayed treatment and potentially life threatening issues. If you have declined to go to the ED at this time, you should call your PCP immediately to set up a follow up appointment.

## 2023-09-04 NOTE — ED Notes (Signed)
Patient is being discharged from the Urgent Care and sent to the Emergency Department via POV . Per Dr.Brimage, patient is in need of higher level of care due to neck & jaw pain. Patient is aware and verbalizes understanding of plan of care.  Vitals:   09/04/23 1814  BP: 132/79  Pulse: 72  Resp: 18  Temp: 99.6 F (37.6 C)  SpO2: 97%

## 2023-09-04 NOTE — ED Triage Notes (Signed)
Patient states chest pain center/left chest since yesterday that doesn't really go away.  Left facial pain since yesterday as well. Patient states she has had some nasal congestion but no cough, sore throat.

## 2023-09-04 NOTE — ED Provider Notes (Signed)
 [facility]    CSN: [id number] Arrival date & time: 09/04/23  1802      History   Chief Complaint Chief Complaint  Patient presents with   Chest Pain   Facial Pain    HPI Erica Pierce is a 48 y.o. female.   HPI  [name] here for central chest pain that is also on the left side that started 2 days ago. Says she "didn't pull no muscle cuz I have no muscle to pull."  Pain is constant.  She has left cheek and jaw pain.  One of her family members died of a massive heart attack and she is afraid.  Has some nasal congestion but no fever, shortness of breath, cough or sore throat.  There has been no nausea, vomiting or diarrhea.   Past Medical History:  Diagnosis Date   Asthma    Chlamydia 1999   Chronic back pain    Gestational diabetes    Hypertension    PCOS (polycystic ovarian syndrome)    Sciatica, left side     Patient Active Problem List   Diagnosis Date Noted   Decreased fetal movement affecting management of pregnancy in third trimester 03/24/2020    Past Surgical History:  Procedure Laterality Date   CESAREAN SECTION     CHOLECYSTECTOMY      OB History     Gravida  1   Para      Term      Preterm      AB      Living         SAB      IAB      Ectopic      Multiple      Live Births               Home Medications    Prior to Admission medications   Medication Sig Start Date End Date Taking? Authorizing Provider  albuterol (VENTOLIN HFA) 108 (90 Base) MCG/ACT inhaler Inhale 2 puffs into the lungs every 4 (four) hours as needed for wheezing or shortness of breath. 11/01/22   Rodriguez-Southworth, Nettie Elm, PA-C  atorvastatin (LIPITOR) 20 MG tablet Take 20 mg by mouth daily.    [provider]  cetirizine (ZYRTEC) 10 MG tablet Take 1 tablet by mouth daily. 03/03/23 03/02/24  [provider]  cyclobenzaprine (FLEXERIL) 10 MG tablet Take 10 mg by mouth 2 (two) times daily as needed.    [provider]   diclofenac (VOLTAREN) 75 MG EC tablet Take 75 mg by mouth in the morning and 75 mg in the evening. 11/23/22 11/23/23  [provider]  omeprazole (PRILOSEC) 20 MG capsule Take 20 mg by mouth daily.    [provider]  VICTOZA 18 MG/3ML SOPN SMARTSIG:0.6 Milligram(s) SUB-Q Daily    [provider]    Family History Family History  Problem Relation Age of Onset   Heart failure Mother    Cancer Paternal Grandmother    Cancer Paternal Grandfather     Social History Social History   Tobacco Use   Smoking status: Former    Types: Cigarettes   Smokeless tobacco: Never  Vaping Use   Vaping status: Never Used  Substance Use Topics   Alcohol use: No   Drug use: Never     Allergies   Sulfa antibiotics, Elemental sulfur, Metformin, and Shellfish allergy   Review of Systems Review of Systems :negative unless otherwise stated in HPI.  Physical Exam Triage Vital Signs ED Triage Vitals  Encounter Vitals Group     BP 09/04/23 1814 132/79     Systolic BP Percentile --      Diastolic BP Percentile --      Pulse Rate 09/04/23 1814 72     Resp 09/04/23 1814 18     Temp 09/04/23 1814 99.6 F (37.6 C)     Temp Source 09/04/23 1814 Oral     SpO2 09/04/23 1814 97 %     Weight 09/04/23 1811 239 lb (108.4 kg)     Height 09/04/23 1811 5\' 6"  (1.676 m)     Head Circumference --      Peak Flow --      Pain Score 09/04/23 1810 8     Pain Loc --      Pain Education --      Exclude from Growth Chart --    No data found.  Updated Vital Signs BP 132/79 (BP Location: Left Arm)   Pulse 72   Temp 99.6 F (37.6 C) (Oral)   Resp 18   Ht 5\' 6"  (1.676 m)   Wt 108.4 kg   LMP 08/09/2023 (Exact Date)   SpO2 97%   BMI 38.58 kg/m   Visual Acuity Right Eye Distance:   Left Eye Distance:   Bilateral Distance:    Right Eye Near:   Left Eye Near:    Bilateral Near:     Physical Exam  GEN: Nontoxic appearing female, in no acute distress  CV: regular rate  and rhythm,  no murmurs, rubs or gallops  appreciated,, no JVP  CHEST WALL: Tenderness to palpation RESP: no increased work of breathing, clear to ascultation bilaterally ABD: Bowel sounds present. Soft, non-tender, non-distended.  SKIN: warm, dry, no rash on visible skin NEURO: alert, moves all extremities appropriately PSYCH: Normal affect, appropriate speech and behavior   UC Treatments / Results  Labs (all labs ordered are listed, but only abnormal results are displayed) Labs Reviewed - No data to display  EKG  See my EKG interpretation in the MDM section  Radiology No results found.   Procedures Procedures (including critical care time)  Medications Ordered in UC Medications - No data to display  Initial Impression / Assessment and Plan / UC Course  I have reviewed the triage vital signs and the nursing notes.  Pertinent labs & imaging results that were available during my care of the patient were reviewed by me and considered in my medical decision making (see chart for details).       Patient is a 48 y.o. female with history of asthma, hypertension, chronic back pain who presents with acute onset central and left-sided chest pain with jaw pain.  Vital signs are stable and she is satting well on room air.  She does have an elevated temperature here of 99.35F.  Cardiopulmonary exam is unremarkable.    Differential diagnosis includes, but is not limited to, ACS, aortic dissection, pulmonary embolism, cardiac tamponade, pneumothorax, pneumonia, pericarditis/myocarditis, GI-related causes including esophagitis/gastritis, and musculoskeletal chest wall pain   Ayline has no history of PE.  Able to [facility] pt out therefore doubt PE.  EKG obtained and is without ST elevations or concern for ischemia, NSR.  Exam not concerning for costochondritis however pain is reproducible, on exam.  This may be MSK related.  She does have some chest wall tenderness.  Patient ask if I can rule out a  heart attack just by touching on  her chest wall and I explained that no I cannot.  Considering that she does have some risk factors with family history recommended ED evaluation for cardiac enzymes if she is agreeable.  She will travel to [facility] emergency department for chest pain.  Discussed MDM, treatment plan and plan for follow-up with patient who agrees with plan.       Final Clinical Impressions(s) / UC Diagnoses   Final diagnoses:  Chest pain, unspecified type  Jaw pain     Discharge Instructions      Given your chest and jaw pain with your risk factors, you were recommended to go to the hospital for further heart evaluation.   You have been advised to follow up immediately in the emergency department for concerning signs or symptoms as discussed during your visit. If you declined EMS transport, please have a family member take you directly to the ED at this time. Do not delay.   Based on concerns about condition, if you do not follow up in the ED, you may risk poor outcomes including worsening of condition, delayed treatment and potentially life threatening issues. If you have declined to go to the ED at this time, you should call your PCP immediately to set up a follow up appointment.      ED Prescriptions   None    PDMP not reviewed this encounter.   Katha Cabal, DO 09/11/23 1439

## 2023-11-29 ENCOUNTER — Ambulatory Visit
Admission: EM | Admit: 2023-11-29 | Discharge: 2023-11-29 | Disposition: A | Discharge status: Home / Self Care | Attending: Physician Assistant | Admitting: Physician Assistant

## 2023-11-29 DIAGNOSIS — R350 Frequency of micturition: Secondary | ICD-10-CM | POA: Diagnosis not present

## 2023-11-29 DIAGNOSIS — N760 Acute vaginitis: Secondary | ICD-10-CM | POA: Diagnosis not present

## 2023-11-29 LAB — URINALYSIS, W/ REFLEX TO CULTURE (INFECTION SUSPECTED)
Bilirubin Urine: NEGATIVE
Glucose, UA: NEGATIVE mg/dL
Ketones, ur: NEGATIVE mg/dL
Leukocytes,Ua: NEGATIVE
Nitrite: NEGATIVE
Protein, ur: NEGATIVE mg/dL
Specific Gravity, Urine: 1.025 (ref 1.005–1.030)
pH: 5.5 (ref 5.0–8.0)

## 2023-11-29 MED ORDER — FLUCONAZOLE 150 MG PO TABS: ORAL_TABLET | ORAL | 0 refills | Status: AC

## 2023-11-29 MED ORDER — METRONIDAZOLE 500 MG PO TABS: 500.0000 mg | ORAL_TABLET | Freq: Two times a day (BID) | ORAL | 0 refills | Status: AC

## 2023-11-29 NOTE — ED Triage Notes (Signed)
 Pt c/o urinary freq x2 wks. Concerned for STD. Denies any hematuria.

## 2023-11-29 NOTE — ED Provider Notes (Signed)
 [facility]    CSN: [id number] Arrival date & time: 11/29/23  1627      History   Chief Complaint Chief Complaint  Patient presents with   Dysuria   SEXUALLY TRANSMITTED DISEASE    HPI Erica Pierce is a 48 y.o. female presenting for 2-week history of thin foul-smelling vaginal discharge, itching and urinary frequency.  States she has not been sexually active in a while but would like to have STD screening. Denies fever, fatigue, dysuria, pelvic pain, abdominal pain, back pain, hematuria.   HPI  Past Medical History:  Diagnosis Date   Asthma    Chlamydia 1999   Chronic back pain    Gestational diabetes    Hypertension    PCOS (polycystic ovarian syndrome)    Sciatica, left side     Patient Active Problem List   Diagnosis Date Noted   Decreased fetal movement affecting management of pregnancy in third trimester 03/24/2020    Past Surgical History:  Procedure Laterality Date   CESAREAN SECTION     CHOLECYSTECTOMY      OB History     Gravida  1   Para      Term      Preterm      AB      Living         SAB      IAB      Ectopic      Multiple      Live Births               Home Medications    Prior to Admission medications   Medication Sig Start Date End Date Taking? Authorizing Provider  metroNIDAZOLE  (FLAGYL ) 500 MG tablet Take 1 tablet (500 mg total) by mouth 2 (two) times daily for 7 days. 11/29/23 12/06/23 Yes Nancy Axon B, PA-C  montelukast (SINGULAIR) 10 MG tablet Take 1 tablet (10 mg total) by mouth nightly. 10/11/23 10/10/24 Yes [provider]  albuterol  (VENTOLIN  HFA) 108 (90 Base) MCG/ACT inhaler Inhale 2 puffs into the lungs every 4 (four) hours as needed for wheezing or shortness of breath. 11/01/22   Rodriguez-Southworth, Sylvia, PA-C  atorvastatin (LIPITOR) 20 MG tablet Take 20 mg by mouth daily.   Yes [provider]  cetirizine (ZYRTEC) 10 MG tablet Take 1 tablet by mouth daily. 03/03/23  03/02/24 Yes [provider]  cyclobenzaprine  (FLEXERIL ) 10 MG tablet Take 10 mg by mouth 2 (two) times daily as needed.   Yes [provider]  omeprazole (PRILOSEC) 20 MG capsule Take 20 mg by mouth daily.   Yes [provider]  VICTOZA 18 MG/3ML SOPN SMARTSIG:0.6 Milligram(s) SUB-Q Daily   Yes [provider]    Family History Family History  Problem Relation Age of Onset   Heart failure Mother    Cancer Paternal Grandmother    Cancer Paternal Grandfather     Social History Social History   Tobacco Use   Smoking status: Former    Types: Cigarettes   Smokeless tobacco: Never  Vaping Use   Vaping status: Never Used  Substance Use Topics   Alcohol use: No   Drug use: Never     Allergies   Sulfa antibiotics, Elemental sulfur, Metformin, and Shellfish allergy   Review of Systems Review of Systems  Constitutional:  Negative for fatigue and fever.  Gastrointestinal:  Negative for abdominal pain.  Genitourinary:  Positive for frequency and vaginal discharge. Negative for dysuria, flank pain, hematuria, urgency,  vaginal bleeding and vaginal pain.  Musculoskeletal:  Negative for back pain.  Skin:  Negative for rash.     Physical Exam Triage Vital Signs  No data found.  Updated Vital Signs BP 135/83 (BP Location: Left Arm)   Pulse 72   Temp 98.4 F (36.9 C) (Oral)   Resp 16   Ht 5\' 6"  (1.676 m)   Wt 239 lb (108.4 kg)   LMP 11/16/2023 (Exact Date)   SpO2 97%   BMI 38.58 kg/m    Physical Exam Vitals and nursing note reviewed.  Constitutional:      General: She is not in acute distress.    Appearance: Normal appearance. She is not ill-appearing or toxic-appearing.  HENT:     Head: Normocephalic and atraumatic.  Eyes:     General: No scleral icterus.       Right eye: No discharge.        Left eye: No discharge.     Conjunctiva/sclera: Conjunctivae normal.  Cardiovascular:     Rate and Rhythm: Normal rate and regular  rhythm.     Heart sounds: Normal heart sounds.  Pulmonary:     Effort: Pulmonary effort is normal. No respiratory distress.     Breath sounds: Normal breath sounds.  Abdominal:     Palpations: Abdomen is soft.     Tenderness: There is no abdominal tenderness. There is no right CVA tenderness or left CVA tenderness.  Musculoskeletal:     Cervical back: Neck supple.  Skin:    General: Skin is dry.  Neurological:     General: No focal deficit present.     Mental Status: She is alert. Mental status is at baseline.     Motor: No weakness.     Gait: Gait normal.  Psychiatric:        Mood and Affect: Mood normal.        Behavior: Behavior normal.      UC Treatments / Results  Labs (all labs ordered are listed, but only abnormal results are displayed) Labs Reviewed  URINALYSIS, W/ REFLEX TO CULTURE (INFECTION SUSPECTED)  CERVICOVAGINAL ANCILLARY ONLY    EKG   Radiology No results found.  Procedures Procedures (including critical care time)  Medications Ordered in UC Medications - No data to display  Initial Impression / Assessment and Plan / UC Course  I have reviewed the triage vital signs and the nursing notes.  Pertinent labs & imaging results that were available during my care of the patient were reviewed by me and considered in my medical decision making (see chart for details).   47-year female presents for foul-smelling vaginal discharge, itching and urinary frequency over the past couple weeks.  Also request STI screening.  No fever, fatigue, abdominal/pelvic/flank pain.  Patient elects to perform vaginal self swab for GC/chlamydia/trich/BV/yeast.  Urinalysis also obtained.  UA not consistent with UTI.    Reviewed patient's history of frequent BV infection.  Will treat for vaginitis with metronidazole  and diflucan .  Pending GC/chlamydia/trich/BV/yeast testing.  Will treat according to results for any other positive results.  Reviewed return  precautions.  Final Clinical Impressions(s) / UC Diagnoses   Final diagnoses:  Acute vaginitis  Urinary frequency     Discharge Instructions      -Sent metronidazole  to pharmacy for suspected BV -Try an over the counter vaginal probiotic to manage pH -Consider boric acid suppository when symptomatic to see if that helps first -If any positive results you will be notified.  ED Prescriptions     Medication Sig Dispense Auth. Provider   metroNIDAZOLE  (FLAGYL ) 500 MG tablet Take 1 tablet (500 mg total) by mouth 2 (two) times daily for 7 days. 14 tablet Sibel Khurana B, PA-C      PDMP not reviewed this encounter.      Floydene Hy, PA-C 11/29/23 1759

## 2023-11-29 NOTE — Discharge Instructions (Addendum)
-  Sent metronidazole  to pharmacy for suspected BV -Try an over the counter vaginal probiotic to manage pH -Consider boric acid suppository when symptomatic to see if that helps first -If any positive results you will be notified.

## 2023-11-30 ENCOUNTER — Ambulatory Visit: Payer: Self-pay | Admitting: Physician Assistant

## 2023-11-30 LAB — CERVICOVAGINAL ANCILLARY ONLY
Bacterial Vaginitis (gardnerella): NEGATIVE
Candida Glabrata: NEGATIVE
Candida Vaginitis: NEGATIVE
Chlamydia: NEGATIVE
Comment: NEGATIVE
Comment: NEGATIVE
Comment: NEGATIVE
Comment: NEGATIVE
Comment: NEGATIVE
Comment: NORMAL
Neisseria Gonorrhea: NEGATIVE
Trichomonas: NEGATIVE

## 2023-12-10 ENCOUNTER — Ambulatory Visit
Admission: EM | Admit: 2023-12-10 | Discharge: 2023-12-10 | Disposition: A | Discharge status: Home / Self Care | Attending: Family Medicine | Admitting: Family Medicine

## 2023-12-10 ENCOUNTER — Encounter: Payer: Self-pay | Admitting: Emergency Medicine

## 2023-12-10 DIAGNOSIS — J029 Acute pharyngitis, unspecified: Secondary | ICD-10-CM

## 2023-12-10 DIAGNOSIS — J069 Acute upper respiratory infection, unspecified: Secondary | ICD-10-CM | POA: Diagnosis present

## 2023-12-10 LAB — GROUP A STREP BY PCR: Group A Strep by PCR: NOT DETECTED

## 2023-12-10 LAB — RESP PANEL BY RT-PCR (FLU A&B, COVID) ARPGX2
Influenza A by PCR: NEGATIVE
Influenza B by PCR: NEGATIVE
SARS Coronavirus 2 by RT PCR: NEGATIVE

## 2023-12-10 MED ORDER — LIDOCAINE VISCOUS HCL 2 % MT SOLN: 15.0000 mL | OROMUCOSAL | 0 refills | Status: AC | PRN

## 2023-12-10 NOTE — Discharge Instructions (Signed)

## 2023-12-10 NOTE — ED Triage Notes (Signed)
 Patient c/o sore throat, sinus congestion and headache that started yesterday.  Patient denies fevers.

## 2023-12-10 NOTE — ED Provider Notes (Signed)
 [facility]    CSN: [id number] Arrival date & time: 12/10/23  1431      History   Chief Complaint Chief Complaint  Patient presents with   Sore Throat   Sinus Problem    HPI Anntoinette Haefele is a 48 y.o. female with history of asthma.  Patient presents today for sore throat, headaches, fatigue,and congestion.  Reports throat burning.  She says she tried over-the-counter cough medicines and they have not helped. Denies fever, body aches, cough, shortness of breath, wheezing, vomiting or diarrhea. Son sick with similar symptoms.  HPI  Past Medical History:  Diagnosis Date   Asthma    Chlamydia 1999   Chronic back pain    Gestational diabetes    Hypertension    PCOS (polycystic ovarian syndrome)    Sciatica, left side     Patient Active Problem List   Diagnosis Date Noted   Decreased fetal movement affecting management of pregnancy in third trimester 03/24/2020    Past Surgical History:  Procedure Laterality Date   CESAREAN SECTION     CHOLECYSTECTOMY      OB History     Gravida  1   Para      Term      Preterm      AB      Living         SAB      IAB      Ectopic      Multiple      Live Births               Home Medications    Prior to Admission medications   Medication Sig Start Date End Date Taking? Authorizing Provider  lidocaine  (XYLOCAINE ) 2 % solution Use as directed 15 mLs in the mouth or throat every 3 (three) hours as needed for mouth pain (swish and spit). 12/10/23  Yes Floydene Hy, PA-C  albuterol  (VENTOLIN  HFA) 108 (90 Base) MCG/ACT inhaler Inhale 2 puffs into the lungs every 4 (four) hours as needed for wheezing or shortness of breath. 11/01/22   Rodriguez-Southworth, Sylvia, PA-C  atorvastatin (LIPITOR) 20 MG tablet Take 20 mg by mouth daily.    [provider]  cetirizine (ZYRTEC) 10 MG tablet Take 1 tablet by mouth daily. 03/03/23 03/02/24  [provider]  cyclobenzaprine  (FLEXERIL ) 10 MG  tablet Take 10 mg by mouth 2 (two) times daily as needed.    [provider]  fluconazole  (DIFLUCAN ) 150 MG tablet Take 1 tab po q72 hr prn yeast 11/29/23   Nancy Axon B, PA-C  montelukast (SINGULAIR) 10 MG tablet Take 1 tablet (10 mg total) by mouth nightly. 10/11/23 10/10/24  [provider]  omeprazole (PRILOSEC) 20 MG capsule Take 20 mg by mouth daily.    [provider]  VICTOZA 18 MG/3ML SOPN SMARTSIG:0.6 Milligram(s) SUB-Q Daily    [provider]    Family History Family History  Problem Relation Age of Onset   Heart failure Mother    Cancer Paternal Grandmother    Cancer Paternal Grandfather     Social History Social History   Tobacco Use   Smoking status: Former    Types: Cigarettes   Smokeless tobacco: Never  Vaping Use   Vaping status: Never Used  Substance Use Topics   Alcohol use: No   Drug use: Never     Allergies   Sulfa antibiotics, Elemental sulfur, Metformin, and Shellfish allergy   Review of Systems Review  of Systems  Constitutional:  Positive for fatigue. Negative for chills, diaphoresis and fever.  HENT:  Positive for congestion, rhinorrhea and sore throat. Negative for ear pain, sinus pressure and sinus pain.   Respiratory:  Negative for cough and shortness of breath.   Cardiovascular:  Negative for chest pain.  Gastrointestinal:  Negative for abdominal pain, nausea and vomiting.  Musculoskeletal:  Negative for arthralgias and myalgias.  Skin:  Negative for rash.  Neurological:  Negative for weakness and headaches.  Hematological:  Negative for adenopathy.     Physical Exam Triage Vital Signs  No data found.  Updated Vital Signs BP (!) 143/88 (BP Location: Right Arm)   Pulse 84   Temp 99.2 F (37.3 C) (Oral)   Resp 15   Ht 5\' 6"  (1.676 m)   Wt 238 lb 15.7 oz (108.4 kg)   LMP 11/16/2023 (Exact Date)   SpO2 96%   BMI 38.57 kg/m    Physical Exam Vitals and nursing note reviewed.   Constitutional:      General: She is not in acute distress.    Appearance: Normal appearance. She is not ill-appearing or toxic-appearing.  HENT:     Head: Normocephalic and atraumatic.     Nose: Congestion present.     Mouth/Throat:     Mouth: Mucous membranes are moist.     Pharynx: Oropharynx is clear. Posterior oropharyngeal erythema present.  Eyes:     General: No scleral icterus.       Right eye: No discharge.        Left eye: No discharge.     Conjunctiva/sclera: Conjunctivae normal.  Cardiovascular:     Rate and Rhythm: Normal rate and regular rhythm.     Heart sounds: Normal heart sounds.  Pulmonary:     Effort: Pulmonary effort is normal. No respiratory distress.     Breath sounds: No wheezing.  Musculoskeletal:     Cervical back: Neck supple.  Skin:    General: Skin is dry.  Neurological:     General: No focal deficit present.     Mental Status: She is alert. Mental status is at baseline.     Motor: No weakness.     Gait: Gait normal.  Psychiatric:        Mood and Affect: Mood normal.        Behavior: Behavior normal.      UC Treatments / Results  Labs (all labs ordered are listed, but only abnormal results are displayed) Labs Reviewed  GROUP A STREP BY PCR  RESP PANEL BY RT-PCR (FLU A&B, COVID) [id number]    EKG   Radiology No results found.  Procedures Procedures (including critical care time)  Medications Ordered in UC Medications - No data to display  Initial Impression / Assessment and Plan / UC Course  I have reviewed the triage vital signs and the nursing notes.  Pertinent labs & imaging results that were available during my care of the patient were reviewed by me and considered in my medical decision making (see chart for details).   48 y/o female presents for sore throat and congestion. No fever, cough or SOB.  History of asthma.  Vitals normal and stable and she is overall well-appearing.  She has nasal congestion, mild posterior  pharyngeal erythema and few scattered wheezes.  No distress.  PCR strep and resp panel performed. All negative.   Viral pharyngitis. Sent viscous lidocaine . Reviewed use of OTC meds. Reviewed return precautions.   Final Clinical  Impressions(s) / UC Diagnoses   Final diagnoses:  Viral upper respiratory tract infection  Sore throat     Discharge Instructions      URI/COLD SYMPTOMS: Your exam today is consistent with a viral illness. Antibiotics are not indicated at this time. Use medications as directed, including cough syrup, nasal saline, and decongestants. Your symptoms should improve over the next few days and resolve within 7-10 days. Increase rest and fluids. F/u if symptoms worsen or predominate such as sore throat, ear pain, productive cough, shortness of breath, or if you develop high fevers or worsening fatigue over the next several days.      ED Prescriptions     Medication Sig Dispense Auth. Provider   lidocaine  (XYLOCAINE ) 2 % solution Use as directed 15 mLs in the mouth or throat every 3 (three) hours as needed for mouth pain (swish and spit). 100 mL Floydene Hy, PA-C      PDMP not reviewed this encounter.    Floydene Hy, PA-C 12/10/23 [phone]

## 2024-01-14 IMAGING — CR DG FOOT COMPLETE 3+V*R*
3 series · 3 of 3 positions shown · non-contrast
Comparison: None Available.

CLINICAL DATA: pain in foot. Fall 2 weeks ago

EXAM:
RIGHT FOOT COMPLETE - 3+ VIEW

[foot ap]
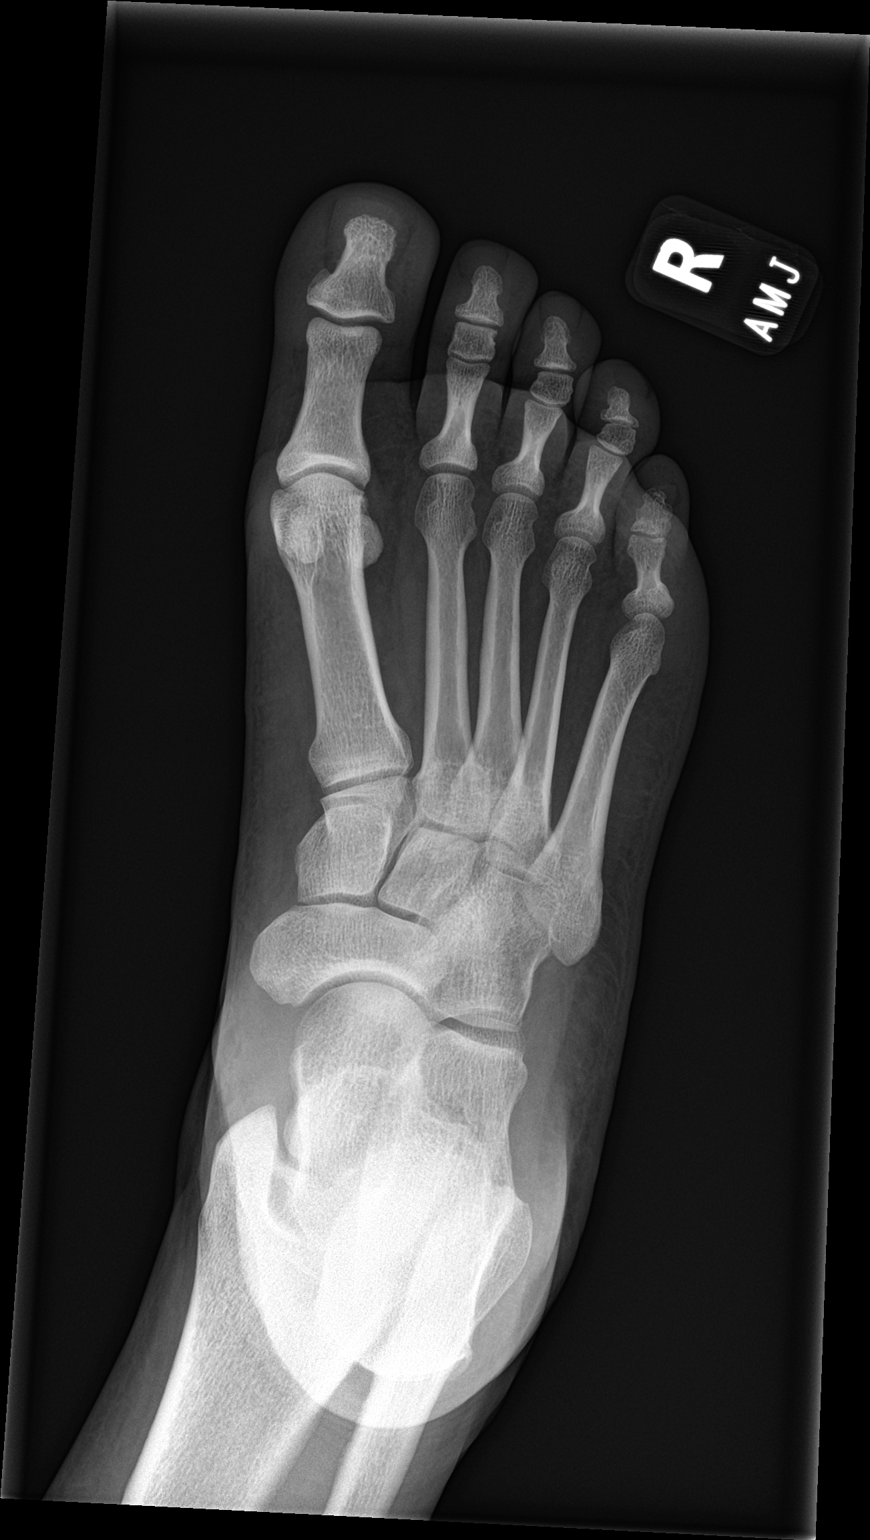

[foot obl]
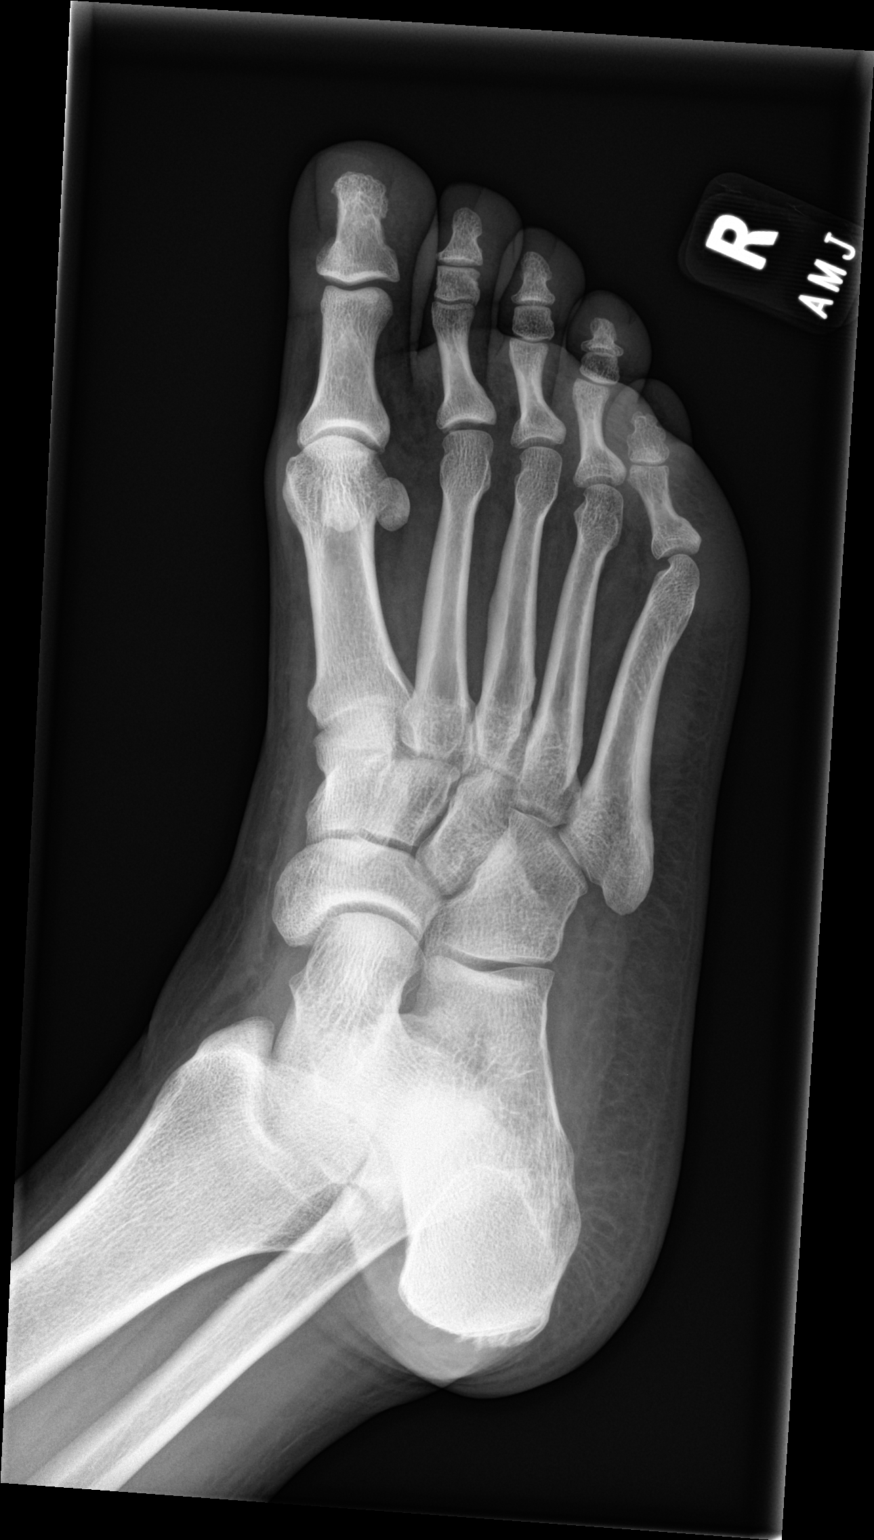

[foot lat]
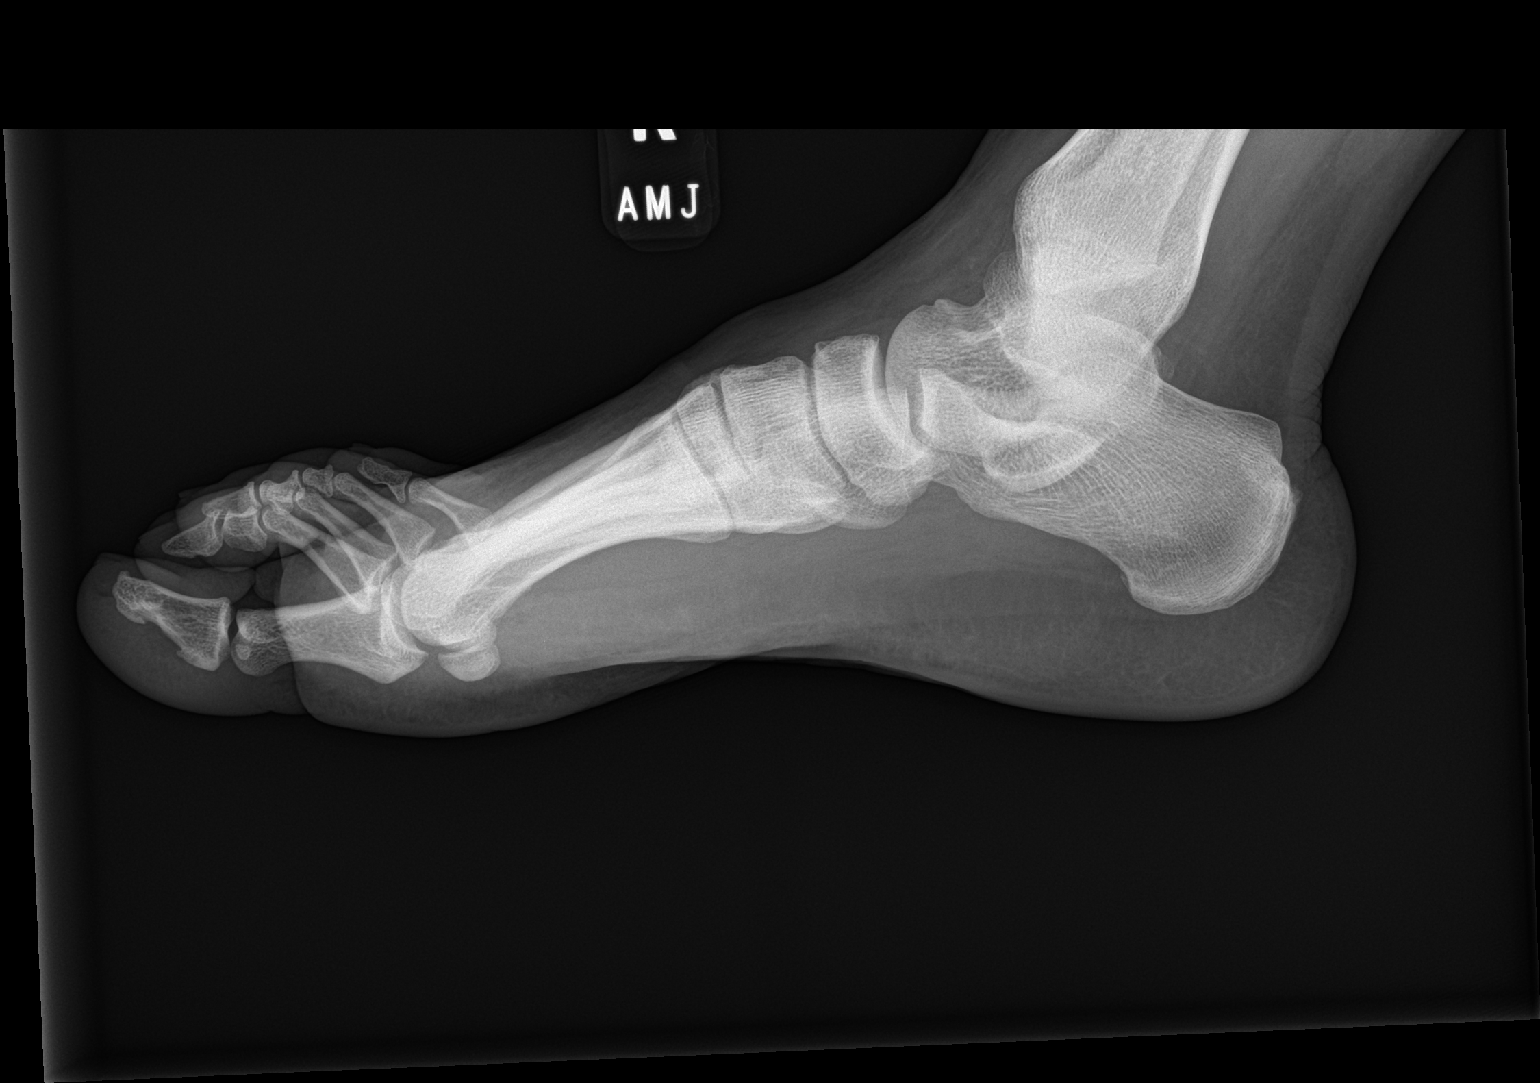

[3 of 3 positions shown; findings below may reference images not displayed]

FINDINGS: There is no evidence of acute fracture. Alignment is normal. There
is mild interphalangeal joint degenerative change. Tiny plantar and
dorsal calcaneal spurs.
IMPRESSION: No evidence of acute fracture in the right foot.

## 2024-01-29 ENCOUNTER — Ambulatory Visit
Admission: EM | Admit: 2024-01-29 | Discharge: 2024-01-29 | Disposition: A | Discharge status: Home / Self Care | Attending: Emergency Medicine | Admitting: Emergency Medicine

## 2024-01-29 ENCOUNTER — Encounter: Payer: Self-pay | Admitting: Emergency Medicine

## 2024-01-29 ENCOUNTER — Ambulatory Visit (INDEPENDENT_AMBULATORY_CARE_PROVIDER_SITE_OTHER)

## 2024-01-29 DIAGNOSIS — S9032XA Contusion of left foot, initial encounter: Secondary | ICD-10-CM | POA: Diagnosis not present

## 2024-01-29 MED ORDER — DICLOFENAC SODIUM 50 MG PO TBEC: 50.0000 mg | DELAYED_RELEASE_TABLET | Freq: Two times a day (BID) | ORAL | 1 refills | Status: AC

## 2024-01-29 NOTE — ED Provider Notes (Signed)
 [facility]  Note:  This document was prepared using Conservation officer, historic buildings and may include unintentional dictation errors.  MRN: [id number] DOB: [birth year]  Subjective:   Erica Pierce is a 48 y.o. female presenting for left dorsal foot pain after dropping a dresser on her foot yesterday while moving it.  Patient reports increased pain with palpation and with ambulation.  Patient denies any previous trauma or injury to this foot.  Patient has not taken any over-the-counter medication to treat symptoms.  Patient was concern for possible foot fracture.  No current facility-administered medications for this encounter.  Current Outpatient Medications:    diclofenac  (VOLTAREN ) 50 MG EC tablet, Take 1 tablet (50 mg total) by mouth 2 (two) times daily., Disp: 30 tablet, Rfl: 1   albuterol  (VENTOLIN  HFA) 108 (90 Base) MCG/ACT inhaler, Inhale 2 puffs into the lungs every 4 (four) hours as needed for wheezing or shortness of breath., Disp: 1 g, Rfl: 0   atorvastatin (LIPITOR) 20 MG tablet, Take 20 mg by mouth daily., Disp: , Rfl:    cetirizine (ZYRTEC) 10 MG tablet, Take 1 tablet by mouth daily., Disp: , Rfl:    cyclobenzaprine  (FLEXERIL ) 10 MG tablet, Take 10 mg by mouth 2 (two) times daily as needed., Disp: , Rfl:    fluconazole  (DIFLUCAN ) 150 MG tablet, Take 1 tab po q72 hr prn yeast, Disp: 2 tablet, Rfl: 0   lidocaine  (XYLOCAINE ) 2 % solution, Use as directed 15 mLs in the mouth or throat every 3 (three) hours as needed for mouth pain (swish and spit)., Disp: 100 mL, Rfl: 0   montelukast (SINGULAIR) 10 MG tablet, Take 1 tablet (10 mg total) by mouth nightly., Disp: , Rfl:    omeprazole (PRILOSEC) 20 MG capsule, Take 20 mg by mouth daily., Disp: , Rfl:    VICTOZA 18 MG/3ML SOPN, SMARTSIG:0.6 Milligram(s) SUB-Q Daily, Disp: , Rfl:    Allergies  Allergen Reactions   Sulfa Antibiotics Hives and Swelling    Other reaction(s): UNKNOWN   Elemental Sulfur    Metformin Diarrhea    Shellfish Allergy Other (See Comments)    Unknown    Past Medical History:  Diagnosis Date   Asthma    Chlamydia 1999   Chronic back pain    Gestational diabetes    Hypertension    PCOS (polycystic ovarian syndrome)    Sciatica, left side      Past Surgical History:  Procedure Laterality Date   CESAREAN SECTION     CHOLECYSTECTOMY      Family History  Problem Relation Age of Onset   Heart failure Mother    Cancer Paternal Grandmother    Cancer Paternal Grandfather     Social History   Tobacco Use   Smoking status: Former    Types: Cigarettes   Smokeless tobacco: Never  Vaping Use   Vaping status: Never Used  Substance Use Topics   Alcohol use: No   Drug use: Never    ROS Refer to HPI for ROS details.  Objective:   Vitals: BP 133/85 (BP Location: Right Arm)   Pulse 79   Temp 98.4 F (36.9 C) (Oral)   Resp 16   LMP 01/12/2024 (Approximate)   SpO2 96%   Physical Exam Vitals and nursing note reviewed.  Constitutional:      General: She is not in acute distress.    Appearance: Normal appearance. She is not ill-appearing.  HENT:     Head: Normocephalic.  Cardiovascular:  Rate and Rhythm: Normal rate.  Pulmonary:     Effort: Pulmonary effort is normal. No respiratory distress.  Musculoskeletal:     Left foot: Decreased range of motion.       Feet:  Feet:     Left foot:     Skin integrity: Erythema present.  Skin:    General: Skin is warm and dry.     Capillary Refill: Capillary refill takes less than 2 seconds.  Neurological:     General: No focal deficit present.     Mental Status: She is alert and oriented to person, place, and time.  Psychiatric:        Mood and Affect: Mood normal.        Behavior: Behavior normal.     Procedures  No results found for this or any previous visit (from the past 24 hours).  Assessment and Plan :     Discharge Instructions       1. Contusion of left foot, initial encounter (Primary) - DG  Foot Complete Left x-ray performed in UC shows moderate soft tissue swelling, no fracture or dislocation, mild spurring noted to fifth toe. - Apply ace wrap for compression and inflammation - Apply Post op shoe for immobilization and protection - diclofenac  (VOLTAREN ) 50 MG EC tablet; Take 1 tablet (50 mg total) by mouth 2 (two) times daily.  Dispense: 30 tablet; Refill: 1 -Apply ice 2-3 times a day for 10 to 15 minutes at a time to help with inflammation and pain      Ethel KATHEE Aurea Aurea, Nedrow B, NP 01/29/24 1528

## 2024-01-29 NOTE — Discharge Instructions (Signed)
  1. Contusion of left foot, initial encounter (Primary) - DG Foot Complete Left x-ray performed in UC shows moderate soft tissue swelling, no fracture or dislocation, mild spurring noted to fifth toe. - Apply ace wrap for compression and inflammation - Apply Post op shoe for immobilization and protection - diclofenac  (VOLTAREN ) 50 MG EC tablet; Take 1 tablet (50 mg total) by mouth 2 (two) times daily.  Dispense: 30 tablet; Refill: 1 -Apply ice 2-3 times a day for 10 to 15 minutes at a time to help with inflammation and pain

## 2024-01-29 NOTE — ED Triage Notes (Signed)
 Pt presents with left foot pain after dropping a dresser on her foot yesterday.

## 2024-03-04 ENCOUNTER — Ambulatory Visit
Admission: EM | Admit: 2024-03-04 | Discharge: 2024-03-04 | Disposition: A | Discharge status: Home / Self Care | Attending: Emergency Medicine | Admitting: Emergency Medicine

## 2024-03-04 ENCOUNTER — Encounter: Payer: Self-pay | Admitting: Emergency Medicine

## 2024-03-04 DIAGNOSIS — H66001 Acute suppurative otitis media without spontaneous rupture of ear drum, right ear: Secondary | ICD-10-CM

## 2024-03-04 DIAGNOSIS — J069 Acute upper respiratory infection, unspecified: Secondary | ICD-10-CM

## 2024-03-04 MED ORDER — AMOXICILLIN-POT CLAVULANATE 875-125 MG PO TABS: 1.0000 | ORAL_TABLET | Freq: Two times a day (BID) | ORAL | 0 refills | Status: AC

## 2024-03-04 MED ORDER — IPRATROPIUM BROMIDE 0.06 % NA SOLN: 2.0000 | Freq: Four times a day (QID) | NASAL | 12 refills | Status: AC

## 2024-03-04 MED ORDER — PROMETHAZINE-DM 6.25-15 MG/5ML PO SYRP: 5.0000 mL | ORAL_SOLUTION | Freq: Four times a day (QID) | ORAL | 0 refills | Status: AC | PRN

## 2024-03-04 NOTE — ED Provider Notes (Signed)
 [facility]    CSN: [id number] Arrival date & time: 03/04/24  1551      History   Chief Complaint Chief Complaint  Patient presents with   Cough   Otalgia    HPI Erica Pierce is a 48 y.o. female.   HPI  21 old female with past medical history significant for PCOS, hypertension, gestational diabetes, chronic back pain, and asthma presents for evaluation of a cough that is not present for last week.  She is also endorsing right ear pain that began yesterday.  She has been using over-the-counter cough syrup without any improvement.  Cough is worse at night.  Past Medical History:  Diagnosis Date   Asthma    Chlamydia 1999   Chronic back pain    Gestational diabetes    Hypertension    PCOS (polycystic ovarian syndrome)    Sciatica, left side     Patient Active Problem List   Diagnosis Date Noted   Decreased fetal movement affecting management of pregnancy in third trimester 03/24/2020    Past Surgical History:  Procedure Laterality Date   CESAREAN SECTION     CHOLECYSTECTOMY      OB History     Gravida  1   Para      Term      Preterm      AB      Living         SAB      IAB      Ectopic      Multiple      Live Births               Home Medications    Prior to Admission medications   Medication Sig Start Date End Date Taking? Authorizing Provider  amoxicillin -clavulanate (AUGMENTIN ) 875-125 MG tablet Take 1 tablet by mouth every 12 (twelve) hours for 7 days. 03/04/24 03/11/24 Yes Bernardino Ditch, NP  ipratropium (ATROVENT ) 0.06 % nasal spray Place 2 sprays into both nostrils 4 (four) times daily. 03/04/24  Yes Bernardino Ditch, NP  promethazine -dextromethorphan (PROMETHAZINE -DM) 6.25-15 MG/5ML syrup Take 5 mLs by mouth 4 (four) times daily as needed. 03/04/24  Yes Bernardino Ditch, NP  albuterol  (VENTOLIN  HFA) 108 (90 Base) MCG/ACT inhaler Inhale 2 puffs into the lungs every 4 (four) hours as needed for wheezing or shortness of breath.  11/01/22   Rodriguez-Southworth, Sylvia, PA-C  atorvastatin (LIPITOR) 20 MG tablet Take 20 mg by mouth daily.    [provider]  cetirizine (ZYRTEC) 10 MG tablet Take 1 tablet by mouth daily. 03/03/23 03/02/24  [provider]  cyclobenzaprine  (FLEXERIL ) 10 MG tablet Take 10 mg by mouth 2 (two) times daily as needed.    [provider]  diclofenac  (VOLTAREN ) 50 MG EC tablet Take 1 tablet (50 mg total) by mouth 2 (two) times daily. 01/29/24   Reddick, Johnathan B, NP  fluconazole  (DIFLUCAN ) 150 MG tablet Take 1 tab po q72 hr prn yeast 11/29/23   Arvis Huxley B, PA-C  lidocaine  (XYLOCAINE ) 2 % solution Use as directed 15 mLs in the mouth or throat every 3 (three) hours as needed for mouth pain (swish and spit). 12/10/23   Arvis Huxley B, PA-C  montelukast (SINGULAIR) 10 MG tablet Take 1 tablet (10 mg total) by mouth nightly. 10/11/23 10/10/24  [provider]  omeprazole (PRILOSEC) 20 MG capsule Take 20 mg by mouth daily.    [provider]  VICTOZA 18 MG/3ML SOPN SMARTSIG:0.6 Milligram(s) SUB-Q Daily  [provider]    Family History Family History  Problem Relation Age of Onset   Heart failure Mother    Cancer Paternal Grandmother    Cancer Paternal Grandfather     Social History Social History   Tobacco Use   Smoking status: Former    Types: Cigarettes   Smokeless tobacco: Never  Vaping Use   Vaping status: Never Used  Substance Use Topics   Alcohol use: No   Drug use: Never     Allergies   Sulfa antibiotics, Elemental sulfur, Metformin, and Shellfish allergy   Review of Systems Review of Systems  Constitutional:  Negative for fever.  HENT:  Positive for ear pain. Negative for congestion, rhinorrhea and sore throat.   Respiratory:  Positive for cough. Negative for shortness of breath and wheezing.      Physical Exam Triage Vital Signs ED Triage Vitals  Encounter Vitals Group     BP      Girls Systolic BP  Percentile      Girls Diastolic BP Percentile      Boys Systolic BP Percentile      Boys Diastolic BP Percentile      Pulse      Resp      Temp      Temp src      SpO2      Weight      Height      Head Circumference      Peak Flow      Pain Score      Pain Loc      Pain Education      Exclude from Growth Chart    No data found.  Updated Vital Signs BP (!) 141/77 (BP Location: Right Wrist)   Pulse 77   Temp 98.2 F (36.8 C) (Oral)   Resp 18   Wt 255 lb (115.7 kg)   SpO2 100%   BMI 41.16 kg/m   Visual Acuity Right Eye Distance:   Left Eye Distance:   Bilateral Distance:    Right Eye Near:   Left Eye Near:    Bilateral Near:     Physical Exam Vitals and nursing note reviewed.  Constitutional:      Appearance: Normal appearance. She is not ill-appearing.  HENT:     Head: Normocephalic and atraumatic.     Right Ear: Ear canal and external ear normal. There is no impacted cerumen.     Left Ear: Tympanic membrane, ear canal and external ear normal. There is no impacted cerumen.     Ears:     Comments: Right TM is erythematous and injected.  Left TM is pearly gray appearance.  Both EACs have mild amounts of cerumen.    Nose: Congestion and rhinorrhea present.     Comments: This mucosa is erythematous and mildly edematous with scant clear discharge in both nares.    Mouth/Throat:     Mouth: Mucous membranes are moist.     Pharynx: Oropharynx is clear. No oropharyngeal exudate or posterior oropharyngeal erythema.  Cardiovascular:     Rate and Rhythm: Normal rate and regular rhythm.     Pulses: Normal pulses.     Heart sounds: Normal heart sounds. No murmur heard.    No friction rub. No gallop.  Pulmonary:     Effort: Pulmonary effort is normal.     Breath sounds: Normal breath sounds. No wheezing, rhonchi or rales.  Musculoskeletal:     Cervical back: Normal range  of motion and neck supple. No tenderness.  Lymphadenopathy:     Cervical: No cervical adenopathy.   Skin:    General: Skin is warm and dry.     Capillary Refill: Capillary refill takes less than 2 seconds.     Findings: No rash.  Neurological:     General: No focal deficit present.     Mental Status: She is alert and oriented to person, place, and time.      UC Treatments / Results  Labs (all labs ordered are listed, but only abnormal results are displayed) Labs Reviewed - No data to display  EKG   Radiology No results found.  Procedures Procedures (including critical care time)  Medications Ordered in UC Medications - No data to display  Initial Impression / Assessment and Plan / UC Course  I have reviewed the triage vital signs and the nursing notes.  Pertinent labs & imaging results that were available during my care of the patient were reviewed by me and considered in my medical decision making (see chart for details).   Patient is a pleasant, nontoxic-appearing 2 old female presenting for evaluation of 1 week worth of cough that is worse at night and then new onset right ear pain that began today.  She denies any runny nose nasal congestion though on exam she does have edematous and erythematous nasal mucosa with scant clear discharge.  Oropharyngeal exam is benign.  No cervical lymphadenopathy present.  Cardiopulmonary exam reveals the lung sounds in all fields.  Otoscopic exam does reveal erythema and injection of her right tympanic membrane.  The left tympanic membrane is pearly gray in appearance.  I will treat the patient for URI with cough and congestion and right otitis media with Augmentin  875 mg twice daily with food for 7 days.  Atrovent  nasal spray 4 times daily along with Promethazine  DM cough syrup for bedtime.  During the day she does not really have a cough.  She has been using her inhaler but not more than usual.   Final Clinical Impressions(s) / UC Diagnoses   Final diagnoses:  URI with cough and congestion  Non-recurrent acute suppurative otitis  media of right ear without spontaneous rupture of tympanic membrane     Discharge Instructions      Take the Augmentin  twice daily for 7 days with food for treatment of your ear infection and URI.  Take an over-the-counter probiotic 1 hour after each dose of antibiotic to prevent diarrhea.  Use over-the-counter Tylenol  and ibuprofen  as needed for pain or fever.  Place a hot water bottle, or heating pad, underneath your pillowcase at night to help dilate up your ear and aid in pain relief as well as resolution of the infection.  Use the Atrovent  nasal spray, 2 squirts up each nostril every 6 hours, to help cut down on postnasal drip which I believe is driving your cough.  Use the Promethazine  DM cough syrup at bedtime as needed for cough and congestion.  Return for reevaluation for any new or worsening symptoms.      ED Prescriptions     Medication Sig Dispense Auth. Provider   amoxicillin -clavulanate (AUGMENTIN ) 875-125 MG tablet Take 1 tablet by mouth every 12 (twelve) hours for 7 days. 14 tablet Bernardino Ditch, NP   ipratropium (ATROVENT ) 0.06 % nasal spray Place 2 sprays into both nostrils 4 (four) times daily. 15 mL Bernardino Ditch, NP   promethazine -dextromethorphan (PROMETHAZINE -DM) 6.25-15 MG/5ML syrup Take 5 mLs by mouth 4 (four)  times daily as needed. 118 mL Bernardino Ditch, NP      PDMP not reviewed this encounter.   Bernardino Ditch, NP 03/04/24 [phone]

## 2024-03-04 NOTE — Discharge Instructions (Signed)
 Take the Augmentin  twice daily for 7 days with food for treatment of your ear infection and URI.  Take an over-the-counter probiotic 1 hour after each dose of antibiotic to prevent diarrhea.  Use over-the-counter Tylenol  and ibuprofen  as needed for pain or fever.  Place a hot water bottle, or heating pad, underneath your pillowcase at night to help dilate up your ear and aid in pain relief as well as resolution of the infection.  Use the Atrovent  nasal spray, 2 squirts up each nostril every 6 hours, to help cut down on postnasal drip which I believe is driving your cough.  Use the Promethazine  DM cough syrup at bedtime as needed for cough and congestion.  Return for reevaluation for any new or worsening symptoms.

## 2024-03-04 NOTE — ED Triage Notes (Signed)
 Pt presents with a cough x 1 week and right ear pain since yesterday. She has taken OTC cough syrup, but it is not helping. Her cough is worse at night.

## 2024-03-28 ENCOUNTER — Ambulatory Visit
Admission: EM | Admit: 2024-03-28 | Discharge: 2024-03-28 | Disposition: A | Discharge status: Home / Self Care | Attending: Family Medicine | Admitting: Family Medicine

## 2024-03-28 DIAGNOSIS — Z202 Contact with and (suspected) exposure to infections with a predominantly sexual mode of transmission: Secondary | ICD-10-CM | POA: Diagnosis not present

## 2024-03-28 DIAGNOSIS — N898 Other specified noninflammatory disorders of vagina: Secondary | ICD-10-CM | POA: Insufficient documentation

## 2024-03-28 NOTE — ED Provider Notes (Signed)
 [facility]    CSN: [id number] Arrival date & time: 03/28/24  1440      History   Chief Complaint Chief Complaint  Patient presents with   Vaginal Itching     HPI HPI Erica Pierce is a 48 y.o. female.    Erica Pierce presents for vaginal discharge for the past 2 days.  She has been wearing silk panties instead clothe panties. Tried nothing for symptoms prior to arrival.  Has not had any antibiotics in last 30 days.   Denies known STI exposure.  Erica Pierce does not use condoms regularly with her husband. She is not currently pregnant.  Patient's last menstrual period was 03/13/2024 (approximate).    - Abnormal vaginal discharge: yes  - vaginal odor: yes  - vaginal bleeding: no - Dysuria: no - Hematuria: no - Urinary urgency:no  - Urinary frequency: no  - Fever: no - Abdominal pain: no - Pelvic pain: no - Rash/Skin lesions/mouth ulcers: no - Nausea: yes  - Vomiting: no  - Back Pain: no        Past Medical History:  Diagnosis Date   Asthma    Chlamydia 1999   Chronic back pain    Gestational diabetes    Hypertension    PCOS (polycystic ovarian syndrome)    Sciatica, left side     Patient Active Problem List   Diagnosis Date Noted   Decreased fetal movement affecting management of pregnancy in third trimester 03/24/2020    Past Surgical History:  Procedure Laterality Date   CESAREAN SECTION     CHOLECYSTECTOMY      OB History     Gravida  1   Para      Term      Preterm      AB      Living         SAB      IAB      Ectopic      Multiple      Live Births               Home Medications    Prior to Admission medications   Medication Sig Start Date End Date Taking? Authorizing Provider  losartan (COZAAR) 25 MG tablet Take 25 mg by mouth daily. 02/06/24 03/12/25 Yes [provider]  albuterol  (VENTOLIN  HFA) 108 (90 Base) MCG/ACT inhaler Inhale 2 puffs into the lungs every 4 (four) hours as needed for  wheezing or shortness of breath. 11/01/22   Rodriguez-Southworth, Sylvia, PA-C  atorvastatin (LIPITOR) 20 MG tablet Take 20 mg by mouth daily.    [provider]  cetirizine (ZYRTEC) 10 MG tablet Take 1 tablet by mouth daily. 03/03/23 03/02/24  [provider]  cyclobenzaprine  (FLEXERIL ) 10 MG tablet Take 10 mg by mouth 2 (two) times daily as needed.    [provider]  diclofenac  (VOLTAREN ) 50 MG EC tablet Take 1 tablet (50 mg total) by mouth 2 (two) times daily. 01/29/24   Reddick, Johnathan B, NP  fluconazole  (DIFLUCAN ) 150 MG tablet Take 1 tab po q72 hr prn yeast 11/29/23   Eaves, Lesley B, PA-C  ipratropium (ATROVENT ) 0.06 % nasal spray Place 2 sprays into both nostrils 4 (four) times daily. 03/04/24   Bernardino Ditch, NP  lidocaine  (XYLOCAINE ) 2 % solution Use as directed 15 mLs in the mouth or throat every 3 (three) hours as needed for mouth pain (swish and spit). 12/10/23   Arvis Huxley B, PA-C  montelukast (SINGULAIR)  10 MG tablet Take 1 tablet (10 mg total) by mouth nightly. 10/11/23 10/10/24  [provider]  omeprazole (PRILOSEC) 20 MG capsule Take 20 mg by mouth daily.    [provider]  promethazine -dextromethorphan (PROMETHAZINE -DM) 6.25-15 MG/5ML syrup Take 5 mLs by mouth 4 (four) times daily as needed. 03/04/24   Bernardino Ditch, NP  VICTOZA 18 MG/3ML SOPN SMARTSIG:0.6 Milligram(s) SUB-Q Daily    [provider]    Family History Family History  Problem Relation Age of Onset   Heart failure Mother    Cancer Paternal Grandmother    Cancer Paternal Grandfather     Social History Social History   Tobacco Use   Smoking status: Former    Types: Cigarettes   Smokeless tobacco: Never  Vaping Use   Vaping status: Never Used  Substance Use Topics   Alcohol use: No   Drug use: Never     Allergies   Sulfa antibiotics, Elemental sulfur, Metformin, and Shellfish allergy   Review of Systems Review of Systems: :negative unless  otherwise stated in HPI.      Physical Exam Triage Vital Signs ED Triage Vitals  Encounter Vitals Group     BP 03/28/24 1453 123/76     Girls Systolic BP Percentile --      Girls Diastolic BP Percentile --      Boys Systolic BP Percentile --      Boys Diastolic BP Percentile --      Pulse Rate 03/28/24 1453 75     Resp 03/28/24 1453 16     Temp 03/28/24 1453 98.9 F (37.2 C)     Temp Source 03/28/24 1453 Oral     SpO2 03/28/24 1453 97 %     Weight 03/28/24 1452 261 lb (118.4 kg)     Height 03/28/24 1452 5' 6 (1.676 m)     Head Circumference --      Peak Flow --      Pain Score 03/28/24 1458 0     Pain Loc --      Pain Education --      Exclude from Growth Chart --    No data found.  Updated Vital Signs BP 123/76 (BP Location: Left Arm)   Pulse 75   Temp 98.9 F (37.2 C) (Oral)   Resp 16   Ht 5' 6 (1.676 m)   Wt 118.4 kg   LMP 03/13/2024 (Approximate)   SpO2 97%   BMI 42.13 kg/m   Visual Acuity Right Eye Distance:   Left Eye Distance:   Bilateral Distance:    Right Eye Near:   Left Eye Near:    Bilateral Near:     Physical Exam GEN: well appearing female in no acute distress  CVS: well perfused  RESP: speaking in full sentences without pause  GU: deferred, patient performed self swab     UC Treatments / Results  Labs (all labs ordered are listed, but only abnormal results are displayed) Labs Reviewed  RPR  HIV ANTIBODY (ROUTINE TESTING W REFLEX)  CERVICOVAGINAL ANCILLARY ONLY    EKG   Radiology No results found.  Procedures Procedures (including critical care time)  Medications Ordered in UC Medications - No data to display  Initial Impression / Assessment and Plan / UC Course  I have reviewed the triage vital signs and the nursing notes.  Pertinent labs & imaging results that were available during my care of the patient were reviewed by me and considered in my medical  decision making (see chart for details).      Patient is a  48 y.o.Erica Pierce female  who presents for dysuria and vaginal discharge.  Overall patient is well-appearing and afebrile.  Vital signs stable. Vaginal swab for yeast vaginitis and bacterial vaginitis, trichomonas, gonorrhea and chlamydia obtained.  HIV and RPR requested and were obtained.  Patient does not request prophylactic STI treatment.   Return precautions including abdominal pain, fever, chills, nausea, or vomiting given. Discussed MDM, treatment plan and plan for follow-up with patient who agrees with plan.        Final Clinical Impressions(s) / UC Diagnoses   Final diagnoses:  Vaginal discharge  Possible exposure to STD     Discharge Instructions       Your STD test results will be available in the next 72 hours. If positive, someone will contact you.  You should see your results in your MyChart account.        ED Prescriptions   None    PDMP not reviewed this encounter.   Shoshana Johal, DO 03/28/24 1525

## 2024-03-28 NOTE — ED Triage Notes (Signed)
 Pt c/o vaginal discharge x2 wks. Concerned for STD.

## 2024-03-28 NOTE — Discharge Instructions (Signed)
 Your STD test results will be available in the next 72 hours. If positive, someone will contact you.  You should see your results in your MyChart account.

## 2024-03-29 LAB — CERVICOVAGINAL ANCILLARY ONLY
Bacterial Vaginitis (gardnerella): NEGATIVE
Candida Glabrata: NEGATIVE
Candida Vaginitis: NEGATIVE
Chlamydia: NEGATIVE
Comment: NEGATIVE
Comment: NEGATIVE
Comment: NEGATIVE
Comment: NEGATIVE
Comment: NEGATIVE
Comment: NORMAL
Neisseria Gonorrhea: NEGATIVE
Trichomonas: NEGATIVE

## 2024-03-29 LAB — HIV ANTIBODY (ROUTINE TESTING W REFLEX): HIV Screen 4th Generation wRfx: NONREACTIVE

## 2024-03-29 LAB — RPR: RPR Ser Ql: NONREACTIVE

## 2024-04-15 ENCOUNTER — Encounter: Payer: Self-pay | Admitting: Emergency Medicine

## 2024-04-15 ENCOUNTER — Ambulatory Visit
Admission: EM | Admit: 2024-04-15 | Discharge: 2024-04-15 | Disposition: A | Discharge status: Home / Self Care | Attending: Family Medicine | Admitting: Family Medicine

## 2024-04-15 DIAGNOSIS — M549 Dorsalgia, unspecified: Secondary | ICD-10-CM

## 2024-04-15 DIAGNOSIS — G8929 Other chronic pain: Secondary | ICD-10-CM

## 2024-04-15 DIAGNOSIS — M5417 Radiculopathy, lumbosacral region: Secondary | ICD-10-CM | POA: Diagnosis not present

## 2024-04-15 MED ORDER — PREDNISONE 10 MG (21) PO TBPK: ORAL_TABLET | Freq: Every day | ORAL | 0 refills | Status: AC

## 2024-04-15 MED ORDER — CYCLOBENZAPRINE HCL 10 MG PO TABS: 10.0000 mg | ORAL_TABLET | Freq: Two times a day (BID) | ORAL | 0 refills | Status: AC | PRN

## 2024-04-15 NOTE — ED Triage Notes (Signed)
 Pt presents with lower back pain since yesterday. Pt has taken flexeril , tylenol  and ibuprofen  for the pain.

## 2024-04-15 NOTE — Discharge Instructions (Addendum)
 If medication was prescribed, stop by the pharmacy to pick up your prescriptions.  For your back pain, Take 1500 mg Tylenol  twice a day, take muscle relaxer and ibuprofen ,  as needed for pain. Take prednisone  as prescribed. Monitor your blood sugar and blood pressure while taking prednisone . Call your primary care provider, if your blood sugar is above 400 or your BP is >180/>100. Consider stopping by the pharmacy or dollar store to pick up some Lidocaine  patches. Apply for 12 hours and then remove.   Watch for worsening symptoms such as an increasing weakness or loss of sensation in your arms or legs, increasing pain and/or the loss of bladder or bowel function. Should any of these occur, go to the emergency department immediately.

## 2024-04-15 NOTE — ED Provider Notes (Signed)
 [facility]    CSN: [id number] Arrival date & time: 04/15/24  1601      History   Chief Complaint Chief Complaint  Patient presents with   Back Pain    HPI  HPI Jaquasia Doscher is a 48 y.o. female.   Estefanny presents for lower back pain that started yesterday. She has to pull heavy objects at work.  She had to pull 6 heavy carts yesterday at work.  Tried Tylenol , ibuprofen  and flexeril  last night.  Today has pain radiating to her feet.  Has history of sciatica and chronic back pain.   Pain rated 10/10. Continues to have pain with movement. Selena does not feel like her legs are weak.      Perianal numbness: no Bowel incontinence: no Bladder incontinence: no Trauma: no  Hydration: normal  Abdominal pain: no Nausea: no Vomiting: no Dysuria:no    Past Medical History:  Diagnosis Date   Asthma    Chlamydia 1999   Chronic back pain    Gestational diabetes    Hypertension    PCOS (polycystic ovarian syndrome)    Sciatica, left side     Patient Active Problem List   Diagnosis Date Noted   Decreased fetal movement affecting management of pregnancy in third trimester 03/24/2020    Past Surgical History:  Procedure Laterality Date   CESAREAN SECTION     CHOLECYSTECTOMY      OB History     Gravida  1   Para      Term      Preterm      AB      Living         SAB      IAB      Ectopic      Multiple      Live Births               Home Medications    Prior to Admission medications   Medication Sig Start Date End Date Taking? Authorizing Provider  predniSONE  (STERAPRED UNI-PAK 21 TAB) 10 MG (21) TBPK tablet Take by mouth daily. Take 6 tabs by mouth daily for 1, then 5 tabs for 1 day, then 4 tabs for 1 day, then 3 tabs for 1 day, then 2 tabs for 1 day, then 1 tab for 1 day. 04/15/24  Yes Doree Kuehne, DO  albuterol  (VENTOLIN  HFA) 108 (90 Base) MCG/ACT inhaler Inhale 2 puffs into the lungs every 4 (four) hours as needed for  wheezing or shortness of breath. 11/01/22   Rodriguez-Southworth, Sylvia, PA-C  atorvastatin (LIPITOR) 20 MG tablet Take 20 mg by mouth daily.    [provider]  cetirizine (ZYRTEC) 10 MG tablet Take 1 tablet by mouth daily. 03/03/23 03/02/24  [provider]  cyclobenzaprine  (FLEXERIL ) 10 MG tablet Take 1 tablet (10 mg total) by mouth 2 (two) times daily as needed. 04/15/24   Kaytlan Behrman, DO  diclofenac  (VOLTAREN ) 50 MG EC tablet Take 1 tablet (50 mg total) by mouth 2 (two) times daily. 01/29/24   Reddick, Johnathan B, NP  fluconazole  (DIFLUCAN ) 150 MG tablet Take 1 tab po q72 hr prn yeast 11/29/23   Eaves, Lesley B, PA-C  ipratropium (ATROVENT ) 0.06 % nasal spray Place 2 sprays into both nostrils 4 (four) times daily. 03/04/24   Bernardino Ditch, NP  lidocaine  (XYLOCAINE ) 2 % solution Use as directed 15 mLs in the mouth or throat every 3 (three) hours as needed for mouth pain (swish  and spit). 12/10/23   Arvis Jolan NOVAK, PA-C  losartan (COZAAR) 25 MG tablet Take 25 mg by mouth daily. 02/06/24 03/12/25  [provider]  montelukast (SINGULAIR) 10 MG tablet Take 1 tablet (10 mg total) by mouth nightly. 10/11/23 10/10/24  [provider]  omeprazole (PRILOSEC) 20 MG capsule Take 20 mg by mouth daily.    [provider]  promethazine -dextromethorphan (PROMETHAZINE -DM) 6.25-15 MG/5ML syrup Take 5 mLs by mouth 4 (four) times daily as needed. 03/04/24   Bernardino Ditch, NP  VICTOZA 18 MG/3ML SOPN SMARTSIG:0.6 Milligram(s) SUB-Q Daily    [provider]    Family History Family History  Problem Relation Age of Onset   Heart failure Mother    Cancer Paternal Grandmother    Cancer Paternal Grandfather     Social History Social History   Tobacco Use   Smoking status: Former    Types: Cigarettes   Smokeless tobacco: Never  Vaping Use   Vaping status: Never Used  Substance Use Topics   Alcohol use: No   Drug use: Never     Allergies   Sulfa  antibiotics, Elemental sulfur, Metformin, and Shellfish allergy   Review of Systems Review of Systems: egative unless otherwise stated in HPI.      Physical Exam Triage Vital Signs ED Triage Vitals  Encounter Vitals Group     BP 04/15/24 1632 134/84     Girls Systolic BP Percentile --      Girls Diastolic BP Percentile --      Boys Systolic BP Percentile --      Boys Diastolic BP Percentile --      Pulse Rate 04/15/24 1632 75     Resp 04/15/24 1632 18     Temp 04/15/24 1632 99 F (37.2 C)     Temp Source 04/15/24 1632 Oral     SpO2 04/15/24 1632 98 %     Weight --      Height --      Head Circumference --      Peak Flow --      Pain Score 04/15/24 1630 10     Pain Loc --      Pain Education --      Exclude from Growth Chart --    No data found.  Updated Vital Signs BP 134/84 (BP Location: Left Arm)   Pulse 75   Temp 99 F (37.2 C) (Oral)   Resp 18   LMP 03/13/2024 (Approximate)   SpO2 98%   Visual Acuity Right Eye Distance:   Left Eye Distance:   Bilateral Distance:    Right Eye Near:   Left Eye Near:    Bilateral Near:     Physical Exam GEN: well appearing female in no acute distress  CVS: well perfused  RESP: speaking in full sentences without pause, no respiratory distress  MSK:  Thoracic and Lumbar Spine: - Inspection: no gross deformity or asymmetry, swelling or ecchymosis. No skin changes  - Palpation: TTP over the midline thoracic and lumbar spinous processes, bilateral lumbar paraspinal muscle tenderness and hypertonicity, no SI joint tenderness bilaterally - ROM: limited active ROM of the lumbar spine in flexion and extension, good thoracic rotation   - Strength: 5/5 strength of lower extremity in L4-S1 nerve root distributions b/l - Neuro: sensation intact in the L4-S1 nerve root distribution b/l - Special testing: Positive bilateral straight leg raise SKIN: warm, dry, no overly skin rash or erythema    UC Treatments /  Results  Labs (all  labs ordered are listed, but only abnormal results are displayed) Labs Reviewed - No data to display  EKG   Radiology No results found.   Procedures Procedures (including critical care time)  Medications Ordered in UC Medications - No data to display  Initial Impression / Assessment and Plan / UC Course  I have reviewed the triage vital signs and the nursing notes.  Pertinent labs & imaging results that were available during my care of the patient were reviewed by me and considered in my medical decision making (see chart for details).      Pt is a 48 y.o.  female with 1 days of lower back pain after pushing carts at work.  Has history of chronic low back pain.  She is afebrile, normotensive and satting well on room air. On chart review, previous lumbar imaging showed normal alignment without significant disc space narrowing which was performed in September 2024.  Abdominal imaging deferred today.  Patient to gradually return to normal activities, as tolerated and continue ordinary activities within the limits permitted by pain. Prescribed prednisone  and muscle relaxer  for pain relief.  Continue home Naprosyn . Tylenol  and Lidocaine  patches PRN for multimodal pain relief. Counseled patient on red flag symptoms and when to seek immediate care.  No red flags suggesting cauda equina syndrome or progressive major motor weakness. Patient to follow up with orthopedic provider if symptoms do not improve with conservative treatment.  Return and ED precautions given.    Discussed MDM, treatment plan and plan for follow-up with patient who agrees with plan.   Final Clinical Impressions(s) / UC Diagnoses   Final diagnoses:  Lumbosacral radiculitis  Chronic back pain, unspecified back location, unspecified back pain laterality     Discharge Instructions      If medication was prescribed, stop by the pharmacy to pick up your prescriptions.  For your back pain, Take 1500 mg Tylenol  twice  a day, take muscle relaxer and ibuprofen ,  as needed for pain. Take prednisone  as prescribed. Monitor your blood sugar and blood pressure while taking prednisone . Call your primary care provider, if your blood sugar is above 400 or your BP is >180/>100. Consider stopping by the pharmacy or dollar store to pick up some Lidocaine  patches. Apply for 12 hours and then remove.   Watch for worsening symptoms such as an increasing weakness or loss of sensation in your arms or legs, increasing pain and/or the loss of bladder or bowel function. Should any of these occur, go to the emergency department immediately.       ED Prescriptions     Medication Sig Dispense Auth. Provider   cyclobenzaprine  (FLEXERIL ) 10 MG tablet Take 1 tablet (10 mg total) by mouth 2 (two) times daily as needed. 30 tablet Ashantia Amaral, DO   predniSONE  (STERAPRED UNI-PAK 21 TAB) 10 MG (21) TBPK tablet Take by mouth daily. Take 6 tabs by mouth daily for 1, then 5 tabs for 1 day, then 4 tabs for 1 day, then 3 tabs for 1 day, then 2 tabs for 1 day, then 1 tab for 1 day. 21 tablet Sender Rueb, DO      PDMP not reviewed this encounter.   Kemper Heupel, DO 04/15/24 1738

## 2024-08-04 ENCOUNTER — Ambulatory Visit
Admission: EM | Admit: 2024-08-04 | Discharge: 2024-08-04 | Disposition: A | Discharge status: Home / Self Care | Attending: Physician Assistant | Admitting: Physician Assistant

## 2024-08-04 ENCOUNTER — Encounter: Payer: Self-pay | Admitting: Emergency Medicine

## 2024-08-04 DIAGNOSIS — N760 Acute vaginitis: Secondary | ICD-10-CM | POA: Diagnosis present

## 2024-08-04 DIAGNOSIS — N898 Other specified noninflammatory disorders of vagina: Secondary | ICD-10-CM | POA: Insufficient documentation

## 2024-08-04 DIAGNOSIS — R350 Frequency of micturition: Secondary | ICD-10-CM | POA: Diagnosis present

## 2024-08-04 LAB — POCT URINE DIPSTICK
Bilirubin, UA: NEGATIVE
Glucose, UA: NEGATIVE mg/dL
Ketones, POC UA: NEGATIVE mg/dL
Leukocytes, UA: NEGATIVE
Nitrite, UA: NEGATIVE
POC PROTEIN,UA: NEGATIVE
Spec Grav, UA: 1.025
Urobilinogen, UA: 0.2 U/dL
pH, UA: 6

## 2024-08-04 MED ORDER — METRONIDAZOLE 500 MG PO TABS: 500.0000 mg | ORAL_TABLET | Freq: Two times a day (BID) | ORAL | 0 refills | Status: AC

## 2024-08-04 NOTE — ED Triage Notes (Addendum)
 Pt c/o lower back pain, and urinary frequency. Started about 2 weeks ago. She states the pain radiates around to her stomach. She states she also has some vaginal discharge. She states she used a different soap and believes she may have BV. She state she does have a vaginal odor.

## 2024-08-04 NOTE — ED Provider Notes (Signed)
 " [facility]    CSN: [id number] Arrival date & time: 08/04/24  1540      History   Chief Complaint Chief Complaint  Patient presents with   Back Pain    HPI Erica Pierce is a 49 y.o. female presenting for 2-week history of thin foul-smelling vaginal discharge, itching and urinary frequency.  States she has not been sexually active in a while but would like to have STD screening. Denies fever, fatigue, dysuria, pelvic pain, abdominal pain, back pain, hematuria.   HPI  Past Medical History:  Diagnosis Date   Asthma    Chlamydia 1999   Chronic back pain    Gestational diabetes    Hypertension    PCOS (polycystic ovarian syndrome)    Sciatica, left side     Patient Active Problem List   Diagnosis Date Noted   Decreased fetal movement affecting management of pregnancy in third trimester 03/24/2020    Past Surgical History:  Procedure Laterality Date   CESAREAN SECTION     CHOLECYSTECTOMY      OB History     Gravida  1   Para      Term      Preterm      AB      Living         SAB      IAB      Ectopic      Multiple      Live Births               Home Medications    Prior to Admission medications  Medication Sig Start Date End Date Taking? Authorizing Provider  metroNIDAZOLE  (FLAGYL ) 500 MG tablet Take 1 tablet (500 mg total) by mouth 2 (two) times daily for 7 days. 08/04/24 08/11/24 Yes Arvis Jolan NOVAK, PA-C  albuterol  (VENTOLIN  HFA) 108 (90 Base) MCG/ACT inhaler Inhale 2 puffs into the lungs every 4 (four) hours as needed for wheezing or shortness of breath. 11/01/22   Rodriguez-Southworth, Sylvia, PA-C  atorvastatin (LIPITOR) 20 MG tablet Take 20 mg by mouth daily.    [provider]  cetirizine (ZYRTEC) 10 MG tablet Take 1 tablet by mouth daily. 03/03/23 03/02/24  [provider]  cyclobenzaprine  (FLEXERIL ) 10 MG tablet Take 1 tablet (10 mg total) by mouth 2 (two) times daily as needed. 04/15/24   Brimage,  Vondra, DO  diclofenac  (VOLTAREN ) 50 MG EC tablet Take 1 tablet (50 mg total) by mouth 2 (two) times daily. 01/29/24   Reddick, Johnathan B, NP  fluconazole  (DIFLUCAN ) 150 MG tablet Take 1 tab po q72 hr prn yeast 11/29/23   Dabid Godown B, PA-C  ipratropium (ATROVENT ) 0.06 % nasal spray Place 2 sprays into both nostrils 4 (four) times daily. 03/04/24   Bernardino Ditch, NP  lidocaine  (XYLOCAINE ) 2 % solution Use as directed 15 mLs in the mouth or throat every 3 (three) hours as needed for mouth pain (swish and spit). 12/10/23   Arvis Jolan NOVAK, PA-C  losartan (COZAAR) 25 MG tablet Take 25 mg by mouth daily. 02/06/24 03/12/25  [provider]  montelukast (SINGULAIR) 10 MG tablet Take 1 tablet (10 mg total) by mouth nightly. 10/11/23 10/10/24  [provider]  omeprazole (PRILOSEC) 20 MG capsule Take 20 mg by mouth daily.    [provider]  predniSONE  (STERAPRED UNI-PAK 21 TAB) 10 MG (21) TBPK tablet Take by mouth daily. Take 6 tabs by mouth daily for 1, then 5 tabs for  1 day, then 4 tabs for 1 day, then 3 tabs for 1 day, then 2 tabs for 1 day, then 1 tab for 1 day. 04/15/24   Brimage, Vondra, DO  promethazine -dextromethorphan (PROMETHAZINE -DM) 6.25-15 MG/5ML syrup Take 5 mLs by mouth 4 (four) times daily as needed. 03/04/24   Bernardino Ditch, NP  VICTOZA 18 MG/3ML SOPN SMARTSIG:0.6 Milligram(s) SUB-Q Daily    [provider]    Family History Family History  Problem Relation Age of Onset   Heart failure Mother    Cancer Paternal Grandmother    Cancer Paternal Grandfather     Social History Social History   Tobacco Use   Smoking status: Former    Types: Cigarettes   Smokeless tobacco: Never  Vaping Use   Vaping status: Never Used  Substance Use Topics   Alcohol use: No   Drug use: Never     Allergies   Sulfa antibiotics, Elemental sulfur, Metformin, and Shellfish allergy   Review of Systems Review of Systems  Constitutional:  Negative for fatigue and  fever.  Gastrointestinal:  Negative for abdominal pain.  Genitourinary:  Positive for frequency and vaginal discharge. Negative for dysuria, flank pain, hematuria, urgency, vaginal bleeding and vaginal pain.  Musculoskeletal:  Negative for back pain.  Skin:  Negative for rash.     Physical Exam Triage Vital Signs  No data found.  Updated Vital Signs BP 131/85 (BP Location: Right Arm)   Pulse 91   Temp 98.5 F (36.9 C) (Oral)   Resp 16   Ht 5' 6 (1.676 m)   Wt 261 lb 0.4 oz (118.4 kg)   LMP 07/30/2024 (Approximate)   SpO2 96%   BMI 42.13 kg/m    Physical Exam Vitals and nursing note reviewed.  Constitutional:      General: She is not in acute distress.    Appearance: Normal appearance. She is not ill-appearing or toxic-appearing.  HENT:     Head: Normocephalic and atraumatic.  Eyes:     General: No scleral icterus.       Right eye: No discharge.        Left eye: No discharge.     Conjunctiva/sclera: Conjunctivae normal.  Cardiovascular:     Rate and Rhythm: Normal rate and regular rhythm.     Heart sounds: Normal heart sounds.  Pulmonary:     Effort: Pulmonary effort is normal. No respiratory distress.     Breath sounds: Normal breath sounds.  Abdominal:     Palpations: Abdomen is soft.     Tenderness: There is no abdominal tenderness. There is no right CVA tenderness or left CVA tenderness.  Musculoskeletal:     Cervical back: Neck supple.  Skin:    General: Skin is dry.  Neurological:     General: No focal deficit present.     Mental Status: She is alert. Mental status is at baseline.     Motor: No weakness.     Gait: Gait normal.  Psychiatric:        Mood and Affect: Mood normal.        Behavior: Behavior normal.      UC Treatments / Results  Labs (all labs ordered are listed, but only abnormal results are displayed) Labs Reviewed  POCT URINE DIPSTICK - Abnormal; Notable for the following components:      Result Value   Blood, UA small (*)     All other components within normal limits  CERVICOVAGINAL ANCILLARY ONLY    EKG  Radiology No results found.  Procedures Procedures (including critical care time)  Medications Ordered in UC Medications - No data to display  Initial Impression / Assessment and Plan / UC Course  I have reviewed the triage vital signs and the nursing notes.  Pertinent labs & imaging results that were available during my care of the patient were reviewed by me and considered in my medical decision making (see chart for details).   48-year female presents for foul-smelling vaginal discharge, itching and urinary frequency over the past couple weeks.  Also request STI screening.  No fever, fatigue, abdominal/pelvic/flank pain.  Patient elects to perform vaginal self swab for GC/chlamydia/trich/BV/yeast.  Urinalysis also obtained.  UA not consistent with UTI.    Reviewed patient's history of frequent BV infection.  Will treat for vaginitis with metronidazole .  Pending GC/chlamydia/trich/BV/yeast testing.  Will treat according to results for any other positive results.  Reviewed return precautions.  Final Clinical Impressions(s) / UC Diagnoses   Final diagnoses:  Vaginal discharge  Urinary frequency  Acute vaginitis     Discharge Instructions      The most common types of vaginal infections are yeast infections and bacterial vaginosis. Neither of which are really considered to be sexually transmitted. Often a pH swab or wet prep is performed and if abnormal may reveal either type of infection. Begin metronidazole  if prescribed for possible BV infection. If there is concern for yeast infection, fluconazole  is often prescribed . Take this as directed. You may also apply topical miconazole (can be purchased OTC) externally for relief of itching. Increase rest and fluid intake. If labs sent out, we will call within 2-5 days with results and amend treatment if necessary. Always try to use pH balanced  washes/wipes, urinate after intercourse, stay hydrated, and take probiotics if you are prone to vaginal infections. Return or see PCP or gynecologist for new/worsening infections.         ED Prescriptions     Medication Sig Dispense Auth. Provider   metroNIDAZOLE  (FLAGYL ) 500 MG tablet Take 1 tablet (500 mg total) by mouth 2 (two) times daily for 7 days. 14 tablet Donyell Ding B, PA-C      PDMP not reviewed this encounter.       Arvis Jolan NOVAK, PA-C 08/04/24 1615  "

## 2024-08-04 NOTE — Discharge Instructions (Signed)

## 2024-08-06 LAB — CERVICOVAGINAL ANCILLARY ONLY
Bacterial Vaginitis (gardnerella): NEGATIVE
Candida Glabrata: NEGATIVE
Candida Vaginitis: NEGATIVE
Comment: NEGATIVE
Comment: NEGATIVE
Comment: NEGATIVE
